# Patient Record
Sex: Female | Born: 1960 | Race: White | Hispanic: No | Smoking: Former smoker
Health system: Southern US, Community
[De-identification: ages and names within clinical notes are randomized; demographics above are authoritative.]

## PROBLEM LIST (undated history)

## (undated) DIAGNOSIS — I10 Essential (primary) hypertension: Secondary | ICD-10-CM

## (undated) DIAGNOSIS — I2510 Atherosclerotic heart disease of native coronary artery without angina pectoris: Secondary | ICD-10-CM

## (undated) DIAGNOSIS — E785 Hyperlipidemia, unspecified: Secondary | ICD-10-CM

## (undated) HISTORY — PX: CARDIAC CATHETERIZATION: SHX172

---

## 2021-06-03 ENCOUNTER — Other Ambulatory Visit (HOSPITAL_COMMUNITY): Payer: Self-pay

## 2021-06-03 ENCOUNTER — Encounter (HOSPITAL_COMMUNITY): Payer: Self-pay | Admitting: Interventional Cardiology

## 2021-06-03 ENCOUNTER — Ambulatory Visit (HOSPITAL_COMMUNITY): Admit: 2021-06-03 | Payer: BLUE CROSS/BLUE SHIELD | Admitting: Interventional Cardiology

## 2021-06-03 ENCOUNTER — Inpatient Hospital Stay (HOSPITAL_COMMUNITY): Payer: BLUE CROSS/BLUE SHIELD

## 2021-06-03 ENCOUNTER — Encounter (HOSPITAL_COMMUNITY)
Admission: EM | Disposition: A | Discharge status: Home / Self Care | Payer: Self-pay | Source: Home / Self Care | Attending: Interventional Cardiology

## 2021-06-03 ENCOUNTER — Inpatient Hospital Stay (HOSPITAL_COMMUNITY)
Admission: EM | Admit: 2021-06-03 | Discharge: 2021-06-05 | DRG: 247 | Disposition: A | Discharge status: Home / Self Care | Payer: BLUE CROSS/BLUE SHIELD | Attending: Interventional Cardiology | Admitting: Interventional Cardiology

## 2021-06-03 DIAGNOSIS — I214 Non-ST elevation (NSTEMI) myocardial infarction: Secondary | ICD-10-CM | POA: Diagnosis not present

## 2021-06-03 DIAGNOSIS — I10 Essential (primary) hypertension: Secondary | ICD-10-CM

## 2021-06-03 DIAGNOSIS — Z720 Tobacco use: Secondary | ICD-10-CM

## 2021-06-03 DIAGNOSIS — Z79899 Other long term (current) drug therapy: Secondary | ICD-10-CM

## 2021-06-03 DIAGNOSIS — I2111 ST elevation (STEMI) myocardial infarction involving right coronary artery: Secondary | ICD-10-CM | POA: Diagnosis not present

## 2021-06-03 DIAGNOSIS — I25110 Atherosclerotic heart disease of native coronary artery with unstable angina pectoris: Secondary | ICD-10-CM | POA: Diagnosis not present

## 2021-06-03 DIAGNOSIS — I6522 Occlusion and stenosis of left carotid artery: Secondary | ICD-10-CM | POA: Diagnosis not present

## 2021-06-03 DIAGNOSIS — D72829 Elevated white blood cell count, unspecified: Secondary | ICD-10-CM | POA: Diagnosis not present

## 2021-06-03 DIAGNOSIS — F419 Anxiety disorder, unspecified: Secondary | ICD-10-CM | POA: Diagnosis present

## 2021-06-03 DIAGNOSIS — R0602 Shortness of breath: Secondary | ICD-10-CM

## 2021-06-03 DIAGNOSIS — Z681 Body mass index (BMI) 19 or less, adult: Secondary | ICD-10-CM | POA: Diagnosis not present

## 2021-06-03 DIAGNOSIS — Z7982 Long term (current) use of aspirin: Secondary | ICD-10-CM

## 2021-06-03 DIAGNOSIS — I2582 Chronic total occlusion of coronary artery: Secondary | ICD-10-CM | POA: Diagnosis present

## 2021-06-03 DIAGNOSIS — I6523 Occlusion and stenosis of bilateral carotid arteries: Secondary | ICD-10-CM | POA: Diagnosis present

## 2021-06-03 DIAGNOSIS — Z91040 Latex allergy status: Secondary | ICD-10-CM

## 2021-06-03 DIAGNOSIS — I9589 Other hypotension: Secondary | ICD-10-CM | POA: Diagnosis not present

## 2021-06-03 DIAGNOSIS — Z7901 Long term (current) use of anticoagulants: Secondary | ICD-10-CM | POA: Diagnosis not present

## 2021-06-03 DIAGNOSIS — R0989 Other specified symptoms and signs involving the circulatory and respiratory systems: Secondary | ICD-10-CM | POA: Diagnosis not present

## 2021-06-03 DIAGNOSIS — I2119 ST elevation (STEMI) myocardial infarction involving other coronary artery of inferior wall: Secondary | ICD-10-CM | POA: Diagnosis not present

## 2021-06-03 DIAGNOSIS — R1310 Dysphagia, unspecified: Secondary | ICD-10-CM | POA: Diagnosis present

## 2021-06-03 DIAGNOSIS — E876 Hypokalemia: Secondary | ICD-10-CM | POA: Diagnosis not present

## 2021-06-03 DIAGNOSIS — E785 Hyperlipidemia, unspecified: Secondary | ICD-10-CM

## 2021-06-03 DIAGNOSIS — R64 Cachexia: Secondary | ICD-10-CM | POA: Diagnosis present

## 2021-06-03 DIAGNOSIS — I2510 Atherosclerotic heart disease of native coronary artery without angina pectoris: Secondary | ICD-10-CM

## 2021-06-03 DIAGNOSIS — Z955 Presence of coronary angioplasty implant and graft: Secondary | ICD-10-CM

## 2021-06-03 DIAGNOSIS — Z8249 Family history of ischemic heart disease and other diseases of the circulatory system: Secondary | ICD-10-CM

## 2021-06-03 DIAGNOSIS — F17210 Nicotine dependence, cigarettes, uncomplicated: Secondary | ICD-10-CM | POA: Diagnosis present

## 2021-06-03 DIAGNOSIS — Z20822 Contact with and (suspected) exposure to covid-19: Secondary | ICD-10-CM | POA: Diagnosis present

## 2021-06-03 DIAGNOSIS — R079 Chest pain, unspecified: Secondary | ICD-10-CM | POA: Diagnosis not present

## 2021-06-03 DIAGNOSIS — R001 Bradycardia, unspecified: Secondary | ICD-10-CM | POA: Diagnosis not present

## 2021-06-03 HISTORY — DX: Hyperlipidemia, unspecified: E78.5

## 2021-06-03 HISTORY — DX: ST elevation (STEMI) myocardial infarction involving other coronary artery of inferior wall: I21.19

## 2021-06-03 HISTORY — DX: Atherosclerotic heart disease of native coronary artery without angina pectoris: I25.10

## 2021-06-03 HISTORY — DX: Essential (primary) hypertension: I10

## 2021-06-03 HISTORY — PX: LEFT HEART CATH AND CORONARY ANGIOGRAPHY: CATH118249

## 2021-06-03 HISTORY — PX: CORONARY/GRAFT ACUTE MI REVASCULARIZATION: CATH118305

## 2021-06-03 HISTORY — DX: Tobacco use: Z72.0

## 2021-06-03 LAB — POCT ACTIVATED CLOTTING TIME
Activated Clotting Time: 269 seconds
Activated Clotting Time: 372 seconds
Activated Clotting Time: 299 seconds
Activated Clotting Time: 317 seconds
Activated Clotting Time: 317 seconds
Activated Clotting Time: >1000 seconds

## 2021-06-03 LAB — COMPREHENSIVE METABOLIC PANEL
ALT: 17 U/L (ref 0–44)
AST: 27 U/L (ref 15–41)
Albumin: 3.2 g/dL — ABNORMAL LOW (ref 3.5–5.0)
Alkaline Phosphatase: 59 U/L (ref 38–126)
Anion gap: 10 (ref 5–15)
BUN: 14 mg/dL (ref 6–20)
CO2: 19 mmol/L — ABNORMAL LOW (ref 22–32)
Calcium: 8.8 mg/dL — ABNORMAL LOW (ref 8.9–10.3)
Chloride: 108 mmol/L (ref 98–111)
Creatinine, Ser: 0.96 mg/dL (ref 0.44–1.00)
GFR, Estimated: >60 mL/min (ref >60)
Glucose, Bld: 145 mg/dL — ABNORMAL HIGH (ref 70–99)
Potassium: 3.9 mmol/L (ref 3.5–5.1)
Sodium: 137 mmol/L (ref 135–145)
Total Bilirubin: 0.7 mg/dL (ref 0.3–1.2)
Total Protein: 6.1 g/dL — ABNORMAL LOW (ref 6.5–8.1)

## 2021-06-03 LAB — LIPID PANEL
Cholesterol: 281 mg/dL — ABNORMAL HIGH (ref 0–200)
Cholesterol: 247 mg/dL — ABNORMAL HIGH (ref 0–200)
HDL: 81 mg/dL (ref >40)
HDL: 79 mg/dL (ref >40)
LDL Cholesterol: 173 mg/dL — ABNORMAL HIGH (ref 0–99)
LDL Cholesterol: 158 mg/dL — ABNORMAL HIGH (ref 0–99)
Total CHOL/HDL Ratio: 3.5 RATIO
Total CHOL/HDL Ratio: 3.1 RATIO
Triglycerides: 135 mg/dL (ref <150)
Triglycerides: 49 mg/dL (ref <150)
VLDL: 27 mg/dL (ref 0–40)
VLDL: 10 mg/dL (ref 0–40)

## 2021-06-03 LAB — POCT I-STAT, CHEM 8
BUN: 15 mg/dL (ref 6–20)
Calcium, Ion: 1.16 mmol/L (ref 1.15–1.40)
Chloride: 108 mmol/L (ref 98–111)
Creatinine, Ser: 0.8 mg/dL (ref 0.44–1.00)
Glucose, Bld: 146 mg/dL — ABNORMAL HIGH (ref 70–99)
HCT: 41 % (ref 36.0–46.0)
Hemoglobin: 13.9 g/dL (ref 12.0–15.0)
Potassium: 4 mmol/L (ref 3.5–5.1)
Sodium: 138 mmol/L (ref 135–145)
TCO2: 20 mmol/L — ABNORMAL LOW (ref 22–32)

## 2021-06-03 LAB — TROPONIN I (HIGH SENSITIVITY): Troponin I (High Sensitivity): 163 ng/L (ref <18)

## 2021-06-03 LAB — PROTIME-INR
INR: 0.9 (ref 0.8–1.2)
Prothrombin Time: 12.2 seconds (ref 11.4–15.2)

## 2021-06-03 LAB — RESP PANEL BY RT-PCR (FLU A&B, COVID) ARPGX2
Influenza A by PCR: NEGATIVE
Influenza B by PCR: NEGATIVE
SARS Coronavirus 2 by RT PCR: NEGATIVE

## 2021-06-03 LAB — CBC
HCT: 39.2 % (ref 36.0–46.0)
Hemoglobin: 13.1 g/dL (ref 12.0–15.0)
MCH: 29 pg (ref 26.0–34.0)
MCHC: 33.4 g/dL (ref 30.0–36.0)
MCV: 86.9 fL (ref 80.0–100.0)
Platelets: 381 10*3/uL (ref 150–400)
RBC: 4.51 MIL/uL (ref 3.87–5.11)
RDW: 14.1 % (ref 11.5–15.5)
WBC: 13.5 10*3/uL — ABNORMAL HIGH (ref 4.0–10.5)
nRBC: 0 % (ref 0.0–0.2)

## 2021-06-03 LAB — HEMOGLOBIN A1C
Hgb A1c MFr Bld: 5.6 % (ref 4.8–5.6)
Mean Plasma Glucose: 114.02 mg/dL

## 2021-06-03 LAB — APTT: aPTT: 26 seconds (ref 24–36)

## 2021-06-03 SURGERY — LEFT HEART CATH AND CORONARY ANGIOGRAPHY
Anesthesia: LOCAL

## 2021-06-03 MED ORDER — ENOXAPARIN SODIUM 40 MG/0.4ML IJ SOSY
40.0000 mg | PREFILLED_SYRINGE | INTRAMUSCULAR | Status: DC
  Administered 2021-06-04: 09:00:00 40 mg via SUBCUTANEOUS
  Filled 2021-06-03: qty 0.4

## 2021-06-03 MED ORDER — SODIUM CHLORIDE 0.9 % IV SOLN
INTRAVENOUS | Status: AC | PRN
  Administered 2021-06-03: 20 mL/h via INTRAVENOUS

## 2021-06-03 MED ORDER — METOPROLOL SUCCINATE ER 25 MG PO TB24
12.5000 mg | ORAL_TABLET | Freq: Every day | ORAL | Status: DC
  Administered 2021-06-03: 15:00:00 12.5 mg via ORAL
  Filled 2021-06-03: qty 1

## 2021-06-03 MED ORDER — TIROFIBAN HCL IN NACL 5-0.9 MG/100ML-% IV SOLN
INTRAVENOUS | Status: AC | PRN
  Administered 2021-06-03: .075 ug/kg/min via INTRAVENOUS

## 2021-06-03 MED ORDER — LOSARTAN POTASSIUM 25 MG PO TABS: 25.0000 mg | ORAL_TABLET | Freq: Every day | ORAL | Status: DC

## 2021-06-03 MED ORDER — HEPARIN (PORCINE) IN NACL 1000-0.9 UT/500ML-% IV SOLN
INTRAVENOUS | Status: DC | PRN
  Administered 2021-06-03: 500 mL

## 2021-06-03 MED ORDER — SODIUM CHLORIDE 0.9 % IV SOLN: 250.0000 mL | INTRAVENOUS | Status: DC | PRN

## 2021-06-03 MED ORDER — SODIUM CHLORIDE 0.9% FLUSH
3.0000 mL | Freq: Two times a day (BID) | INTRAVENOUS | Status: DC
  Administered 2021-06-04 – 2021-06-05 (×4): 3 mL via INTRAVENOUS

## 2021-06-03 MED ORDER — LIDOCAINE HCL (PF) 1 % IJ SOLN
INTRAMUSCULAR | Status: DC | PRN
  Administered 2021-06-03: 2 mL

## 2021-06-03 MED ORDER — TICAGRELOR 90 MG PO TABS
ORAL_TABLET | ORAL | Status: AC
  Filled 2021-06-03: qty 2

## 2021-06-03 MED ORDER — MIDAZOLAM HCL 2 MG/2ML IJ SOLN
INTRAMUSCULAR | Status: DC | PRN
  Administered 2021-06-03: .5 mg via INTRAVENOUS

## 2021-06-03 MED ORDER — VERAPAMIL HCL 2.5 MG/ML IV SOLN
INTRAVENOUS | Status: AC
  Filled 2021-06-03: qty 2

## 2021-06-03 MED ORDER — FENTANYL CITRATE (PF) 100 MCG/2ML IJ SOLN
INTRAMUSCULAR | Status: DC | PRN
  Administered 2021-06-03 (×2): 25 ug via INTRAVENOUS

## 2021-06-03 MED ORDER — SODIUM CHLORIDE 0.9 % IV SOLN
INTRAVENOUS | Status: DC | PRN
  Administered 2021-06-03: 20 mL/h via INTRAVENOUS

## 2021-06-03 MED ORDER — HEPARIN (PORCINE) IN NACL 1000-0.9 UT/500ML-% IV SOLN
INTRAVENOUS | Status: DC | PRN
  Administered 2021-06-03 (×2): 500 mL

## 2021-06-03 MED ORDER — TIROFIBAN HCL IN NACL 5-0.9 MG/100ML-% IV SOLN
INTRAVENOUS | Status: AC
  Filled 2021-06-03: qty 100

## 2021-06-03 MED ORDER — ACETAMINOPHEN 325 MG PO TABS: 650.0000 mg | ORAL_TABLET | ORAL | Status: DC | PRN

## 2021-06-03 MED ORDER — FUROSEMIDE 10 MG/ML IJ SOLN
40.0000 mg | Freq: Once | INTRAMUSCULAR | Status: AC
  Administered 2021-06-03: 19:00:00 40 mg via INTRAVENOUS
  Filled 2021-06-03: qty 4

## 2021-06-03 MED ORDER — HEPARIN SODIUM (PORCINE) 1000 UNIT/ML IJ SOLN
INTRAMUSCULAR | Status: DC | PRN
  Administered 2021-06-03: 6000 [IU] via INTRAVENOUS
  Administered 2021-06-03 (×2): 2000 [IU] via INTRAVENOUS

## 2021-06-03 MED ORDER — ASPIRIN 81 MG PO CHEW
81.0000 mg | CHEWABLE_TABLET | Freq: Every day | ORAL | Status: DC
  Administered 2021-06-04 – 2021-06-05 (×2): 81 mg via ORAL
  Filled 2021-06-03 (×2): qty 1

## 2021-06-03 MED ORDER — NITROGLYCERIN 1 MG/10 ML FOR IR/CATH LAB
INTRA_ARTERIAL | Status: DC | PRN
  Administered 2021-06-03 (×2): 200 ug via INTRACORONARY

## 2021-06-03 MED ORDER — IOHEXOL 350 MG/ML SOLN
INTRAVENOUS | Status: DC | PRN
  Administered 2021-06-03: 14:00:00 220 mL

## 2021-06-03 MED ORDER — VERAPAMIL HCL 2.5 MG/ML IV SOLN
INTRAVENOUS | Status: DC | PRN
  Administered 2021-06-03: 12:00:00 10 mL via INTRA_ARTERIAL

## 2021-06-03 MED ORDER — ATROPINE SULFATE 1 MG/10ML IJ SOSY
PREFILLED_SYRINGE | INTRAMUSCULAR | Status: DC | PRN
  Administered 2021-06-03: 1 mg via INTRAVENOUS

## 2021-06-03 MED ORDER — SODIUM CHLORIDE 0.9% FLUSH: 3.0000 mL | INTRAVENOUS | Status: DC | PRN

## 2021-06-03 MED ORDER — SODIUM CHLORIDE 0.9 % WEIGHT BASED INFUSION
2.0000 mL/kg/h | INTRAVENOUS | Status: AC
  Administered 2021-06-03: 15:00:00 2 mL/kg/h via INTRAVENOUS

## 2021-06-03 MED ORDER — POTASSIUM CHLORIDE CRYS ER 20 MEQ PO TBCR
20.0000 meq | EXTENDED_RELEASE_TABLET | Freq: Once | ORAL | Status: AC
  Administered 2021-06-03: 19:00:00 20 meq via ORAL
  Filled 2021-06-03: qty 1

## 2021-06-03 MED ORDER — FENTANYL CITRATE (PF) 100 MCG/2ML IJ SOLN
INTRAMUSCULAR | Status: AC
  Filled 2021-06-03: qty 2

## 2021-06-03 MED ORDER — ONDANSETRON HCL 4 MG/2ML IJ SOLN
4.0000 mg | Freq: Four times a day (QID) | INTRAMUSCULAR | Status: DC | PRN
  Administered 2021-06-03 – 2021-06-04 (×2): 4 mg via INTRAVENOUS
  Filled 2021-06-03 (×2): qty 2

## 2021-06-03 MED ORDER — HEPARIN (PORCINE) IN NACL 1000-0.9 UT/500ML-% IV SOLN
INTRAVENOUS | Status: AC
  Filled 2021-06-03: qty 1000

## 2021-06-03 MED ORDER — TIROFIBAN (AGGRASTAT) BOLUS VIA INFUSION
INTRAVENOUS | Status: DC | PRN
  Administered 2021-06-03: 13:00:00 895 ug via INTRAVENOUS

## 2021-06-03 MED ORDER — HEPARIN (PORCINE) IN NACL 1000-0.9 UT/500ML-% IV SOLN
INTRAVENOUS | Status: AC
  Filled 2021-06-03: qty 500

## 2021-06-03 MED ORDER — ATROPINE SULFATE 1 MG/10ML IJ SOSY
PREFILLED_SYRINGE | INTRAMUSCULAR | Status: AC
  Filled 2021-06-03: qty 10

## 2021-06-03 MED ORDER — LOSARTAN POTASSIUM 50 MG PO TABS
50.0000 mg | ORAL_TABLET | Freq: Every day | ORAL | Status: DC
  Administered 2021-06-04 – 2021-06-05 (×2): 50 mg via ORAL
  Filled 2021-06-03 (×2): qty 1

## 2021-06-03 MED ORDER — ATORVASTATIN CALCIUM 80 MG PO TABS
80.0000 mg | ORAL_TABLET | Freq: Every day | ORAL | Status: DC
  Administered 2021-06-03 – 2021-06-05 (×3): 80 mg via ORAL
  Filled 2021-06-03 (×3): qty 1

## 2021-06-03 MED ORDER — NITROGLYCERIN 1 MG/10 ML FOR IR/CATH LAB
INTRA_ARTERIAL | Status: AC
  Filled 2021-06-03: qty 10

## 2021-06-03 MED ORDER — TICAGRELOR 90 MG PO TABS
ORAL_TABLET | ORAL | Status: DC | PRN
  Administered 2021-06-03: 180 mg via ORAL

## 2021-06-03 MED ORDER — LABETALOL HCL 5 MG/ML IV SOLN
10.0000 mg | INTRAVENOUS | Status: AC | PRN
  Administered 2021-06-03 (×3): 10 mg via INTRAVENOUS
  Filled 2021-06-03 (×4): qty 4

## 2021-06-03 MED ORDER — TICAGRELOR 90 MG PO TABS
90.0000 mg | ORAL_TABLET | Freq: Two times a day (BID) | ORAL | Status: DC
  Administered 2021-06-03 – 2021-06-05 (×4): 90 mg via ORAL
  Filled 2021-06-03 (×4): qty 1

## 2021-06-03 MED ORDER — OXYCODONE HCL 5 MG PO TABS: 5.0000 mg | ORAL_TABLET | ORAL | Status: DC | PRN

## 2021-06-03 MED ORDER — ONDANSETRON HCL 4 MG/2ML IJ SOLN
INTRAMUSCULAR | Status: DC | PRN
  Administered 2021-06-03: 4 mg via INTRAVENOUS

## 2021-06-03 MED ORDER — HEPARIN SODIUM (PORCINE) 1000 UNIT/ML IJ SOLN
INTRAMUSCULAR | Status: AC
  Filled 2021-06-03: qty 10

## 2021-06-03 MED ORDER — LIDOCAINE HCL (PF) 1 % IJ SOLN
INTRAMUSCULAR | Status: AC
  Filled 2021-06-03: qty 30

## 2021-06-03 MED ORDER — MIDAZOLAM HCL 2 MG/2ML IJ SOLN
INTRAMUSCULAR | Status: AC
  Filled 2021-06-03: qty 2

## 2021-06-03 MED ORDER — HYDRALAZINE HCL 20 MG/ML IJ SOLN
10.0000 mg | INTRAMUSCULAR | Status: AC | PRN
  Administered 2021-06-03 (×4): 10 mg via INTRAVENOUS
  Filled 2021-06-03 (×4): qty 1

## 2021-06-03 MED ORDER — METOPROLOL SUCCINATE ER 25 MG PO TB24
25.0000 mg | ORAL_TABLET | Freq: Every day | ORAL | Status: DC
  Administered 2021-06-04 – 2021-06-05 (×2): 25 mg via ORAL
  Filled 2021-06-03 (×2): qty 1

## 2021-06-03 SURGICAL SUPPLY — 29 items
BALL SAPPHIRE NC24 3.0X26 (BALLOONS) ×3
BALLN SAPPHIRE 2.0X20 (BALLOONS) ×3
BALLN SAPPHIRE ~~LOC~~ 3.5X15 (BALLOONS) ×2 IMPLANT
BALLOON SAPPHIRE 2.0X20 (BALLOONS) IMPLANT
BALLOON SAPPHIRE NC24 3.0X26 (BALLOONS) IMPLANT
CATH INFINITI 5 FR JL3.5 (CATHETERS) ×2 IMPLANT
CATH INFINITI 5FR MULTPACK ANG (CATHETERS) ×2 IMPLANT
CATH INFINITI JR4 5F (CATHETERS) ×2 IMPLANT
CATH LAUNCHER 6FR IMA SH (CATHETERS) ×2 IMPLANT
CATH VISTA GUIDE 6FR IM 90 CM (CATHETERS) ×2 IMPLANT
CATH VISTA GUIDE 6FR JR4 (CATHETERS) ×2 IMPLANT
DEVICE RAD TR BAND REGULAR (VASCULAR PRODUCTS) ×2 IMPLANT
ELECT DEFIB PAD ADLT CADENCE (PAD) ×2 IMPLANT
GLIDESHEATH SLEND A-KIT 6F 22G (SHEATH) ×2 IMPLANT
GUIDELINER 6F (CATHETERS) ×2 IMPLANT
GUIDEWIRE INQWIRE 1.5J.035X260 (WIRE) IMPLANT
INQWIRE 1.5J .035X260CM (WIRE) ×3
KIT ENCORE 26 ADVANTAGE (KITS) ×2 IMPLANT
KIT HEART LEFT (KITS) ×3 IMPLANT
PACK CARDIAC CATHETERIZATION (CUSTOM PROCEDURE TRAY) ×3 IMPLANT
SHEATH PROBE COVER 6X72 (BAG) ×2 IMPLANT
STENT ONYX FRONTIER 2.5X30 (Permanent Stent) ×2 IMPLANT
STENT ONYX FRONTIER 3.0X18 (Permanent Stent) ×2 IMPLANT
STENT ONYX FRONTIER 3.5X22 (Permanent Stent) ×2 IMPLANT
TRANSDUCER W/STOPCOCK (MISCELLANEOUS) ×3 IMPLANT
TUBING CIL FLEX 10 FLL-RA (TUBING) ×3 IMPLANT
WIRE ASAHI PROWATER 180CM (WIRE) ×2 IMPLANT
WIRE COUGAR XT STRL 190CM (WIRE) ×2 IMPLANT
WIRE HI TORQ VERSACORE-J 145CM (WIRE) ×2 IMPLANT

## 2021-06-03 NOTE — Progress Notes (Signed)
BP remains 160s / 90s after giving 40 mg hydralazine and 40 mg lasix. PRN hydralazine discontinued. MD notified and is ok with BP in the 180s. No new orders at this time. Will continue to monitor

## 2021-06-03 NOTE — TOC Benefit Eligibility Note (Signed)
Patient Teacher, English as a foreign language completed.    The patient is currently admitted and upon discharge could be taking Brilinta 90 mg.  The current 30 day co-pay is, $25.00.   The patient is insured through [employer] [place], [place] Patient Advocate Specialist [place] Patient Advocate Team Direct Number: [phone]  Fax: [phone]

## 2021-06-03 NOTE — H&P (Signed)
CRITICAL CARE TIME [place]   Cardiology Admission History and Physical:   Patient ID: Carrie Butler MRN: [id number]; DOB: 1961-05-23   Admission date: 06/03/2021  PCP:  No primary care provider on file.   [facility] Providers Cardiologist: Illene Labrador, III   Chief Complaint:  Inferior STEMI  Patient Profile:   Carrie Butler is a 60 y.o. female with a longstanding history of tobacco use and hypertension who is being seen 06/03/2021 for the evaluation of 18-hour history of intermittent chest pain that became severe at 4:30 AM and was graded at 10 out of 10 in severity..  History of Present Illness:   [name] who began having chest discomfort around 6 PM on 06/02/2021.  The discomfort waxed and waned.  At 4:30 AM she began having severe pain that was unremitting.  EMS was summoned.  Upon arrival at [facility] she was complains of 2 out of 10 pain.  Transmitted EKG prior to arrival revealed inferior ST elevation with reciprocal V1 and V2 T wave and ST segment changes.  1 pack/day for greater than 20 years.  Prefers naturopathic therapy to medication management for hypertension.  She uses cannabis for pain control and management of lipids.  She had cannabis this morning at around 7:30 AM.  She feels that blood pressure medications caused her lipid levels to go up.  She is not on any conventional medical therapy.   History reviewed. No pertinent past medical history.  Past Surgical History:  Procedure Laterality Date   CORONARY/GRAFT ACUTE MI REVASCULARIZATION N/A 06/03/2021   Procedure: Coronary/Graft Acute MI Revascularization;  Surgeon: Belva Crome, MD;  Location: [place] [facility];  Service: Cardiovascular;  Laterality: N/A;   LEFT HEART CATH AND CORONARY ANGIOGRAPHY N/A 06/03/2021   Procedure: LEFT HEART CATH AND CORONARY ANGIOGRAPHY;  Surgeon: Belva Crome, MD;  Location: [place] [facility];  Service: Cardiovascular;  Laterality: N/A;      Medications Prior to Admission: Prior to Admission medications   Not on File     Allergies:   Not on File  Social History:   Social History   Socioeconomic History   Marital status: Married    Spouse name: Not on file   Number of children: Not on file   Years of education: Not on file   Highest education level: Not on file  Occupational History   Not on file  Tobacco Use   Smoking status: Not on file   Smokeless tobacco: Not on file  Substance and Sexual Activity   Alcohol use: Not on file   Drug use: Not on file   Sexual activity: Not on file  Other Topics Concern   Not on file  Social History Narrative   Not on file   Social Determinants of Health   Financial Resource Strain: Not on file  Food Insecurity: Not on file  Transportation Needs: Not on file  Physical Activity: Not on file  Stress: Not on file  Social Connections: Not on file  Intimate Partner Violence: Not on file    Family History: Mother had MI in her 60s. The patient's family history is not on file.    ROS:  Please see the history of present illness.  Has cold induced urticaria.  She is allergic to latex.  Her significant other, [place], feels that we will not be able to get her to take medications of any type.  All other ROS reviewed and negative.  Physical Exam/Data:   Vitals:   06/03/21 1200 06/03/21 1430 06/03/21 1451 06/03/21 1453  BP:  (!) 180/135 (!) 172/104 (!) 172/104  Pulse:  93 80   Resp:  20    Temp:  98.6 F (37 C)    TempSrc:  Oral    SpO2:  100%    Weight: 35.8 kg 35.8 kg    Height: 4\' 10"  (1.473 m) 4\' 10"  (1.473 m)      Intake/Output Summary (Last 24 hours) at 06/03/2021 1523 Last data filed at 06/03/2021 1500 Gross per 24 hour  Intake --  Output 200 ml  Net -200 ml   Last 3 Weights 06/03/2021 06/03/2021  Weight (lbs) 78 lb 15.9 oz 78 lb 15.9 oz  Weight (kg) 35.83 kg 35.83 kg     Body mass index is 16.51 kg/m.  General:  Well nourished, well developed, in  no acute distress.  She appears much older than her stated age. HEENT: normal Neck: moderate JVD Vascular: No carotid bruits; Distal pulses 2+ bilaterally   Cardiac:  normal S1, S2; RRR; no murmur. Lungs:  clear to auscultation bilaterally, no wheezing, rhonchi or rales  Abd: soft, nontender, no hepatomegaly  Ext: no edema Musculoskeletal:  No deformities, BUE and BLE strength normal and equal Skin: warm and dry  Neuro:  CNs 2-12 intact, no focal abnormalities noted Psych:  Normal affect    EKG:  The ECG that was done by EMS in [facility] was personally reviewed and demonstrates demonstrated 2 mm of ST elevation in lead II, III, aVF, and ST depression in V1 and V2.  Relevant CV Studies: LDL is 173.  Total cholesterol 281.  Albumin is 3.2. Hemoglobin A1c 5.6  Laboratory Data:  High Sensitivity Troponin:   Recent Labs  Lab 06/03/21 1144  TROPONINIHS 163*      Chemistry Recent Labs  Lab 06/03/21 1144 06/03/21 1152  NA 137 138  K 3.9 4.0  CL 108 108  CO2 19*  --   GLUCOSE 145* 146*  BUN 14 15  CREATININE 0.96 0.80  CALCIUM 8.8*  --   GFRNONAA >60  --   ANIONGAP 10  --     Recent Labs  Lab 06/03/21 1144  PROT 6.1*  ALBUMIN 3.2*  AST 27  ALT 17  ALKPHOS 59  BILITOT 0.7   Lipids  Recent Labs  Lab 06/03/21 1144  CHOL 281*  TRIG 135  HDL 81  LDLCALC 173*  CHOLHDL 3.5   Hematology Recent Labs  Lab 06/03/21 1144 06/03/21 1152  WBC 13.5*  --   RBC 4.51  --   HGB 13.1 13.9  HCT 39.2 41.0  MCV 86.9  --   MCH 29.0  --   MCHC 33.4  --   RDW 14.1  --   PLT 381  --    Thyroid No results for input(s): TSH, FREET4 in the last 168 hours. BNPNo results for input(s): BNP, PROBNP in the last 168 hours.  DDimer No results for input(s): DDIMER in the last 168 hours.   Radiology/Studies:  CARDIAC CATHETERIZATION  Result Date: 06/03/2021   A stent was successfully placed.   Post intervention, there is a 50% residual stenosis.   Post intervention, there  is a 70% residual stenosis. Acute inferior ST elevation MI due to total mid vessel occlusion Successful complicated procedure requiring guide catheter extension, double wire, and overlapping stenting from distal vessel to the ostium of RCA.  3 stents used.  Residual stenosis  of approximately 30% in the mid vessel due to prolapse of tissue at site of stent overlap.  The ostial RCA stent overhangs into the aorta significantly.  Not likely to be able to selectively engage in the future.  Beyond the stented segment there is a angulated 50 to 70% stenosis. Ostial 70 to 80% circumflex.  First obtuse marginal 70% mid.  Mid circumflex 50%. Distal left main 20 to 30%. Ostial LAD 20 to 30%.  30% mid. Moderate inferobasal hypokinesis with EF 40%. RECOMMENDATIONS: Aggressive risk factor modification including smoking cessation, and encouraging the patient to use medications for control of blood pressure and cholesterol. High risk for recurrent acute ischemic events due to phenotype and conventional medical therapy refusal     Assessment and Plan:   Waxing and waning chest discomfort for 18 hours prior to admission with severe discomfort commencing at 4:30 AM and EKG demonstrating evidence of acute inferior wall injury consistent with ST elevation MI. Skeptical of medical therapy and prefers naturopathic and cannabis-based therapy. Hyperlipidemia untreated Primary hypertension untreated  RECOMMENDATIONS:  Coronary angiography and mechanical revascularization if indicated Medical therapy risk factors. Smoking cessation.   Risk Assessment/Risk Scores:    TIMI Risk Score for ST  Elevation MI:   The patient's TIMI risk score is 5, which indicates a 12.4% risk of all cause mortality at 30 days.     Severity of Illness: The appropriate patient status for this patient is INPATIENT. Inpatient status is judged to be reasonable and necessary in order to provide the required intensity of service to ensure the  patient's safety. The patient's presenting symptoms, physical exam findings, and initial radiographic and laboratory data in the context of their chronic comorbidities is felt to place them at high risk for further clinical deterioration. Furthermore, it is not anticipated that the patient will be medically stable for discharge from the hospital within 2 midnights of admission.   * I certify that at the point of admission it is my clinical judgment that the patient will require inpatient hospital care spanning beyond 2 midnights from the point of admission due to high intensity of service, high risk for further deterioration and high frequency of surveillance required.*   For questions or updates, please contact [place] Please consult [email or web address] for contact info under     CRITICAL CARE TIME: 70 minutes   Signed, Sinclair Grooms, MD  06/03/2021 3:23 PM  In [facility]

## 2021-06-03 NOTE — H&P (Signed)
Completed.

## 2021-06-03 NOTE — Progress Notes (Signed)
Called for TR band check. TR band present on right radial artery. No hematoma present. Puncture site oozing slightly. BP 183/136  2cc air added to TRband for a total of 16cc. 2H staff treating blood pressure. SPo2 100% as measured in right thumb. Capillary refill present.   Dr. Tamala Julian present at bedside.

## 2021-06-03 NOTE — Progress Notes (Signed)
° °  Called by RN, pt was becoming SOB and uncomfortable.  Post stents to RCA due to STEMI.   I gave 40 lasix IV and ordered PCXR.  BP is elevated as well so this may help with both.  She did receive toprol XL 12.5 mg earlier as well.    Cecilie Kicks, FNP-C [facility] [place]  [phone]  06/03/2021.

## 2021-06-03 NOTE — CV Procedure (Signed)
RCA total occlusion mid vessel.  Vessel is very tortuous.  50 to 70% distal stenosis with an acute angulation. 70% ostial circumflex.  Obtuse marginal branches contain significant tortuosity. Left main contains 30% distal narrowing LAD is large, tortuous, with luminal irregularities but no fixed obstruction. Inferobasal severe hypokinesis.  EF 50%.  LVEDP 14 mmHg. Complicated PCI of total occlusion proximal to mid RCA related to vessel tortuosity proximal and distal requiring double wires, guide catheter extension, and IMA guide catheter with sideholes. RCA 100% stenosis to less than 20% with tissue prolapse through the mid stent area.  TIMI grade III flow is noted.  Did not extend it from the very distal artery back to the ostium of the RCA.  The stent overhangs the ostium significantly and will probably prevent selective engagement in the future.

## 2021-06-03 NOTE — Progress Notes (Signed)
°   06/03/21 1046  Clinical Encounter Type  Visited With Family  Visit Type Initial;Psychological support;Social support;ED  Referral From Nurse   [facility] responded to Code STEMI in ED; when pt. arrived via EMS medical team decided to send her directly to cath lab; [facility] escorted pt.'s significant other to [place] waiting area to await update from team.  S.O. shared that pt. developed chest pain and left-sided numbness last night but refused to go to the hospital until this AM; no further needs expressed at this time.  Lindaann Pascal, Chaplain Pager: [phone]

## 2021-06-04 ENCOUNTER — Inpatient Hospital Stay (HOSPITAL_COMMUNITY): Payer: BLUE CROSS/BLUE SHIELD

## 2021-06-04 ENCOUNTER — Other Ambulatory Visit: Payer: Self-pay

## 2021-06-04 ENCOUNTER — Other Ambulatory Visit (HOSPITAL_COMMUNITY): Payer: Self-pay

## 2021-06-04 DIAGNOSIS — R64 Cachexia: Secondary | ICD-10-CM | POA: Diagnosis not present

## 2021-06-04 DIAGNOSIS — Z20822 Contact with and (suspected) exposure to covid-19: Secondary | ICD-10-CM | POA: Diagnosis not present

## 2021-06-04 DIAGNOSIS — I9589 Other hypotension: Secondary | ICD-10-CM | POA: Diagnosis not present

## 2021-06-04 LAB — CBC
HCT: 35.1 % — ABNORMAL LOW (ref 36.0–46.0)
Hemoglobin: 11.8 g/dL — ABNORMAL LOW (ref 12.0–15.0)
MCH: 29.1 pg (ref 26.0–34.0)
MCHC: 33.6 g/dL (ref 30.0–36.0)
MCV: 86.7 fL (ref 80.0–100.0)
Platelets: 344 10*3/uL (ref 150–400)
RBC: 4.05 MIL/uL (ref 3.87–5.11)
RDW: 14.2 % (ref 11.5–15.5)
WBC: 12.7 10*3/uL — ABNORMAL HIGH (ref 4.0–10.5)
nRBC: 0 % (ref 0.0–0.2)

## 2021-06-04 LAB — BASIC METABOLIC PANEL
Anion gap: 9 (ref 5–15)
BUN: 16 mg/dL (ref 6–20)
CO2: 23 mmol/L (ref 22–32)
Calcium: 8.7 mg/dL — ABNORMAL LOW (ref 8.9–10.3)
Chloride: 101 mmol/L (ref 98–111)
Creatinine, Ser: 1.1 mg/dL — ABNORMAL HIGH (ref 0.44–1.00)
GFR, Estimated: 58 mL/min — ABNORMAL LOW (ref >60)
Glucose, Bld: 129 mg/dL — ABNORMAL HIGH (ref 70–99)
Potassium: 3.5 mmol/L (ref 3.5–5.1)
Sodium: 133 mmol/L — ABNORMAL LOW (ref 135–145)

## 2021-06-04 LAB — HEMOGLOBIN A1C
Hgb A1c MFr Bld: 5.7 % — ABNORMAL HIGH (ref 4.8–5.6)
Mean Plasma Glucose: 116.89 mg/dL

## 2021-06-04 LAB — GLUCOSE, CAPILLARY: Glucose-Capillary: 116 mg/dL — ABNORMAL HIGH (ref 70–99)

## 2021-06-04 MED ORDER — METOPROLOL SUCCINATE ER 25 MG PO TB24
25.0000 mg | ORAL_TABLET | Freq: Every day | ORAL | 12 refills | Status: DC
  Filled 2021-06-04: qty 30, 30d supply, fill #0

## 2021-06-04 MED ORDER — HYDRALAZINE HCL 25 MG PO TABS
25.0000 mg | ORAL_TABLET | Freq: Three times a day (TID) | ORAL | Status: DC
  Administered 2021-06-04 – 2021-06-05 (×3): 25 mg via ORAL
  Filled 2021-06-04 (×3): qty 1

## 2021-06-04 MED ORDER — ENOXAPARIN SODIUM 30 MG/0.3ML IJ SOSY
30.0000 mg | PREFILLED_SYRINGE | INTRAMUSCULAR | Status: DC
  Administered 2021-06-05: 08:00:00 30 mg via SUBCUTANEOUS
  Filled 2021-06-04: qty 0.3

## 2021-06-04 MED ORDER — NITROGLYCERIN 2 % TD OINT
1.0000 [in_us] | TOPICAL_OINTMENT | Freq: Four times a day (QID) | TRANSDERMAL | Status: DC
  Administered 2021-06-04: 19:00:00 1 [in_us] via TOPICAL
  Filled 2021-06-04: qty 1
  Filled 2021-06-04: qty 30

## 2021-06-04 MED ORDER — LORAZEPAM 0.5 MG PO TABS
0.5000 mg | ORAL_TABLET | Freq: Four times a day (QID) | ORAL | Status: DC | PRN
  Filled 2021-06-04: qty 1

## 2021-06-04 MED ORDER — ATORVASTATIN CALCIUM 80 MG PO TABS
80.0000 mg | ORAL_TABLET | Freq: Every day | ORAL | 12 refills | Status: DC
  Filled 2021-06-04: qty 30, 30d supply, fill #0

## 2021-06-04 MED ORDER — CHLORHEXIDINE GLUCONATE CLOTH 2 % EX PADS
6.0000 | MEDICATED_PAD | Freq: Every day | CUTANEOUS | Status: DC
  Administered 2021-06-04: 17:00:00 6 via TOPICAL

## 2021-06-04 MED ORDER — HYDRALAZINE HCL 20 MG/ML IJ SOLN
10.0000 mg | Freq: Once | INTRAMUSCULAR | Status: AC
  Administered 2021-06-04: 18:00:00 10 mg via INTRAVENOUS
  Filled 2021-06-04: qty 1

## 2021-06-04 MED ORDER — TICAGRELOR 90 MG PO TABS
90.0000 mg | ORAL_TABLET | Freq: Two times a day (BID) | ORAL | 12 refills | Status: DC
  Filled 2021-06-04: qty 60, 30d supply, fill #0

## 2021-06-04 MED ORDER — POTASSIUM CHLORIDE CRYS ER 20 MEQ PO TBCR
40.0000 meq | EXTENDED_RELEASE_TABLET | Freq: Once | ORAL | Status: AC
  Administered 2021-06-04: 09:00:00 40 meq via ORAL
  Filled 2021-06-04: qty 2

## 2021-06-04 MED ORDER — HYDRALAZINE HCL 20 MG/ML IJ SOLN
10.0000 mg | Freq: Once | INTRAMUSCULAR | Status: AC
  Administered 2021-06-04: 19:00:00 10 mg via INTRAVENOUS

## 2021-06-04 MED ORDER — HYDRALAZINE HCL 20 MG/ML IJ SOLN
10.0000 mg | INTRAMUSCULAR | Status: DC | PRN
  Administered 2021-06-04: 17:00:00 10 mg via INTRAVENOUS
  Filled 2021-06-04: qty 1

## 2021-06-04 NOTE — Discharge Instructions (Signed)
Information about your medication: Brilinta (anti-platelet agent)  Generic Name (Brand): ticagrelor (Brilinta), twice daily medication  PURPOSE: You are taking this medication along with aspirin to lower your chance of having a heart attack, stroke, or blood clots in your heart stent. These can be fatal. Brilinta and aspirin help prevent platelets from sticking together and forming a clot that can block an artery or your stent.   Common SIDE EFFECTS you may experience include: bruising or bleeding more easily, shortness of breath  Do not stop taking BRILINTA without talking to the doctor who prescribes it for you. People who are treated with a stent and stop taking Brilinta too soon, have a higher risk of getting a blood clot in the stent, having a heart attack, or dying. If you stop Brilinta because of bleeding, or for other reasons, your risk of a heart attack or stroke may increase.   Tell all of your doctors and dentists that you are taking Brilinta. They should talk to the doctor who prescribed Brilinta for you before you have any surgery or invasive procedure.   Contact your health care provider if you experience: severe or uncontrollable bleeding, pink/red/brown urine, vomiting blood or vomit that looks like "coffee grounds", red or black stools (looks like tar), coughing up blood or blood clots ----------------------------------------------------------------------------------------------------------------------   Information about your medication: Statin (cholesterol-lowering agent)  Generic Name (Brand): atorvastatin (Lipitor) PURPOSE: You are taking this medication to lower your "bad" cholesterol (LDL) and to prevent heart attacks and strokes. Statins can also raise your "good" cholesterol (HDL).  Common SIDE EFFECTS you may experience include: muscle pain or weakness (especially in the legs) and upset stomach.  Take your medication exactly as prescribed. Do not eat large amounts of  grapefruit or grapefruit juice while taking this medication.  Contact your health care provider if you experience: severe muscle pain that does not improve, dark urine, or yellowing of your skin or eyes. ----------------------------------------------------------------------------------------------------------------------  Information about your medication:  Beta Blocker (Heart rate/Blood Pressure-lowering agent)  Generic Name (Brand): metoprolol succinate (Toprol XL)  PURPOSE: You are taking this medication to lower blood pressure and heart rate to help prevent further damage to your heart and to reduce your risk of death following your cardiac event.   Common SIDE EFFECTS you may experience include: fatigue, dizziness, diarrhea, or nausea. This medication may also mask the signs of hypoglycemia  Take your medication exactly as prescribed.   Contact your health care provider if you experience: severely lower bllod pressure, syncope, palpitations, or chest pain muscle ----------------------------------------------------------------------------------------------------------------------

## 2021-06-04 NOTE — Progress Notes (Signed)
Attempted carotid artery duplex x2, however patient is vomiting. Will attempt again tomorrow 12/24 as schedule permits.  06/04/2021 4:50 PM Kelby Aline., MHA, RVT, RDCS, RDMS

## 2021-06-04 NOTE — Progress Notes (Signed)
Progress Note  Patient Name: Carrie Butler Date of Encounter: 06/04/2021  [facility] Cardiologist: Dr. Tamala Julian  Subjective   Postop day #1 inferior MI treated with PCI and drug-eluting stenting of the mid and proximal RCA using 3 overlapping drug-eluting stents.  She did have moderate distal left main and high-grade ostial circumflex disease.  Her LV function was preserved.  She is pain-free this morning and ready for transfer.  Inpatient Medications    Scheduled Meds:  aspirin  81 mg Oral Daily   atorvastatin  80 mg Oral Daily   Chlorhexidine Gluconate Cloth  6 each Topical Daily   enoxaparin (LOVENOX) injection  40 mg Subcutaneous Q24H   losartan  50 mg Oral Daily   metoprolol succinate  25 mg Oral Daily   potassium chloride  40 mEq Oral Once   sodium chloride flush  3 mL Intravenous Q12H   ticagrelor  90 mg Oral BID   Continuous Infusions:  sodium chloride     PRN Meds: sodium chloride, acetaminophen, ondansetron (ZOFRAN) IV, oxyCODONE, sodium chloride flush   Vital Signs    Vitals:   06/04/21 0400 06/04/21 0500 06/04/21 0600 06/04/21 0836  BP: (!) 103/59 110/61 (!) 122/57   Pulse: 68 63 (!) 54   Resp: (!) 21 20 19    Temp:    98.7 F (37.1 C)  TempSrc:    Oral  SpO2: 99% 100% 100%   Weight:      Height:        Intake/Output Summary (Last 24 hours) at 06/04/2021 0843 Last data filed at 06/04/2021 0837 Gross per 24 hour  Intake --  Output 2650 ml  Net -2650 ml   Last 3 Weights 06/03/2021 06/03/2021  Weight (lbs) 78 lb 15.9 oz 78 lb 15.9 oz  Weight (kg) 35.83 kg 35.83 kg      Telemetry    Normal sinus rhythm- Personally Reviewed  ECG    Not performed today- Personally Reviewed  Physical Exam   GEN: No acute distress.   Neck: No JVD Cardiac: RRR, no murmurs, rubs, or gallops.  Respiratory: Clear to auscultation bilaterally. GI: Soft, nontender, non-distended  MS: No edema; No deformity. Neuro:  Nonfocal  Psych: Normal affect   Labs     High Sensitivity Troponin:   Recent Labs  Lab 06/03/21 1144  TROPONINIHS 163*     Chemistry Recent Labs  Lab 06/03/21 1144 06/03/21 1152 06/04/21 0259  NA 137 138 133*  K 3.9 4.0 3.5  CL 108 108 101  CO2 19*  --  23  GLUCOSE 145* 146* 129*  BUN 14 15 16   CREATININE 0.96 0.80 1.10*  CALCIUM 8.8*  --  8.7*  PROT 6.1*  --   --   ALBUMIN 3.2*  --   --   AST 27  --   --   ALT 17  --   --   ALKPHOS 59  --   --   BILITOT 0.7  --   --   GFRNONAA >60  --  58*  ANIONGAP 10  --  9    Lipids  Recent Labs  Lab 06/03/21 1623  CHOL 247*  TRIG 49  HDL 79  LDLCALC 158*  CHOLHDL 3.1    Hematology Recent Labs  Lab 06/03/21 1144 06/03/21 1152 06/04/21 0259  WBC 13.5*  --  12.7*  RBC 4.51  --  4.05  HGB 13.1 13.9 11.8*  HCT 39.2 41.0 35.1*  MCV 86.9  --  86.7  MCH 29.0  --  29.1  MCHC 33.4  --  33.6  RDW 14.1  --  14.2  PLT 381  --  344   Thyroid No results for input(s): TSH, FREET4 in the last 168 hours.  BNPNo results for input(s): BNP, PROBNP in the last 168 hours.  DDimer No results for input(s): DDIMER in the last 168 hours.   Radiology    CARDIAC CATHETERIZATION  Result Date: 06/03/2021   A stent was successfully placed.   Post intervention, there is a 50% residual stenosis.   Post intervention, there is a 70% residual stenosis. Acute inferior ST elevation MI due to total mid vessel occlusion Successful complicated procedure requiring guide catheter extension, double wire, and overlapping stenting from distal vessel to the ostium of RCA.  3 stents used.  Residual stenosis of approximately 30% in the mid vessel due to prolapse of tissue at site of stent overlap.  The ostial RCA stent overhangs into the aorta significantly.  Not likely to be able to selectively engage in the future.  Beyond the stented segment there is a angulated 50 to 70% stenosis. Ostial 70 to 80% circumflex.  First obtuse marginal 70% mid.  Mid circumflex 50%. Distal left main 20 to 30%. Ostial  LAD 20 to 30%.  30% mid. Moderate inferobasal hypokinesis with EF 40%. RECOMMENDATIONS: Aggressive risk factor modification including smoking cessation, and encouraging the patient to use medications for control of blood pressure and cholesterol. High risk for recurrent acute ischemic events due to phenotype and conventional medical therapy refusal   DG CHEST PORT 1 VIEW  Result Date: 06/03/2021 CLINICAL DATA:  Shortness of breath EXAM: PORTABLE CHEST 1 VIEW COMPARISON:  None. FINDINGS: The heart size and mediastinal contours are within normal limits. Both lungs are clear. The visualized skeletal structures are unremarkable. Bilateral nipple shadows are noted. IMPRESSION: No active disease. Electronically Signed   By: Inez Catalina M.D.   On: 06/03/2021 19:15    Cardiac Studies   Cardiac catheterization/PCI drug-eluting stent RCA (06/03/2021)  Conclusion      A stent was successfully placed.   Post intervention, there is a 50% residual stenosis.   Post intervention, there is a 70% residual stenosis.   Acute inferior ST elevation MI due to total mid vessel occlusion Successful complicated procedure requiring guide catheter extension, double wire, and overlapping stenting from distal vessel to the ostium of RCA.  3 stents used.  Residual stenosis of approximately 30% in the mid vessel due to prolapse of tissue at site of stent overlap.  The ostial RCA stent overhangs into the aorta significantly.  Not likely to be able to selectively engage in the future.  Beyond the stented segment there is a angulated 50 to 70% stenosis. Ostial 70 to 80% circumflex.  First obtuse marginal 70% mid.  Mid circumflex 50%. Distal left main 20 to 30%. Ostial LAD 20 to 30%.  30% mid. Moderate inferobasal hypokinesis with EF 40%.   RECOMMENDATIONS:   Aggressive risk factor modification including smoking cessation, and encouraging the patient to use medications for control of blood pressure and cholesterol. High  risk for recurrent acute ischemic events due to phenotype and conventional medical therapy refusal  Coronary Diagrams  Diagnostic Dominance: Right Intervention    Patient Profile     60 y.o. female with a history of hypertension and tobacco abuse who was admitted with inferior STEMI transfer from [facility].  She underwent urgent cardiac catheterization via the right radial approach  by Dr. Tamala Julian revealing an occluded proximal dominant RCA.  The vessel was tortuous revascularization was technically challenging.  She had 3 overlapping drug-eluting stents from the mid to distal vessel out to the ostium with slight extension beyond the ostium .  She did have mild to moderate distal left main and high-grade ostial circumflex disease with preserved LV function.  She is asymptomatic this morning.  Assessment & Plan    1: Inferior STEMI-postop day 1 inferior STEMI treated with PCI drug-eluting stenting of 3 overlapping drug-eluting stents by Dr. Tamala Julian and effectively challenging procedure because of tortuosity.  She did have moderate distal left main and high-grade ostial circumflex disease.  She is on dual antiplatelet therapy including aspirin and Brilinta which she will need to be on uninterrupted for 12 months.  She is also on appropriate guideline directed optimal medical therapy for post MI patients including high-dose statin therapy.  2: Essential hypertension-on no blood pressure medicines at home.  Currently on beta-blocker and losartan.  3: Hyperlipidemia-LDL 158.  High-dose statin therapy started  4: Tobacco abuse-strongly encouraged tobacco cessation  Patient is postop day 1 inferior STEMI.  I believe that medication compliance is going to be an issue.  I stressed the importance of taking her medicines as directed as well as pursuing aggressive factor modification.  She is stable for transfer to telemetry today.  Rehab will ambulate.  I anticipate discharge tomorrow.  She lives in [place]  and can follow-up with our office there.  For questions or updates, please contact [place] Please consult [email or web address] for contact info under        Signed, Quay Burow, MD  06/04/2021, 8:43 AM

## 2021-06-04 NOTE — Progress Notes (Signed)
EKG CRITICAL VALUE     12 lead EKG performed.  Critical value noted.  Herma Mering RN notified.   Warren Lacy, CCT 06/04/2021 7:34 AM

## 2021-06-04 NOTE — Progress Notes (Signed)
Pt ambulated with RN earlier. Sts she tolerated well but tired after walk. Discussed with pt and husband MI, stent, Brilinta importance, restrictions, diet, smoking cessation, exercise, NTG, and CRPII. Pt very receptive. She is being open minded to needing meds now and sts she is willing to take Brilinta and statin. She plans to quit smoking. She already eats heart healthy. Will refer to [place]. Husband very supportive. [phone] [place], ACSM 11:49 AM 06/04/2021

## 2021-06-05 ENCOUNTER — Inpatient Hospital Stay (HOSPITAL_COMMUNITY): Payer: BLUE CROSS/BLUE SHIELD

## 2021-06-05 ENCOUNTER — Other Ambulatory Visit: Payer: Self-pay | Admitting: Physician Assistant

## 2021-06-05 DIAGNOSIS — R0989 Other specified symptoms and signs involving the circulatory and respiratory systems: Secondary | ICD-10-CM

## 2021-06-05 DIAGNOSIS — I6522 Occlusion and stenosis of left carotid artery: Secondary | ICD-10-CM

## 2021-06-05 HISTORY — DX: Occlusion and stenosis of left carotid artery: I65.22

## 2021-06-05 LAB — LIPOPROTEIN A (LPA): Lipoprotein (a): 152.5 nmol/L — ABNORMAL HIGH (ref <75.0)

## 2021-06-05 MED ORDER — NITROGLYCERIN 0.4 MG SL SUBL: 0.4000 mg | SUBLINGUAL_TABLET | SUBLINGUAL | 3 refills | Status: DC | PRN

## 2021-06-05 MED ORDER — METOPROLOL SUCCINATE ER 50 MG PO TB24: 50.0000 mg | ORAL_TABLET | Freq: Every day | ORAL | 3 refills | Status: DC

## 2021-06-05 MED ORDER — HYDRALAZINE HCL 25 MG PO TABS: 25.0000 mg | ORAL_TABLET | Freq: Three times a day (TID) | ORAL | 5 refills | Status: DC

## 2021-06-05 MED ORDER — ATORVASTATIN CALCIUM 80 MG PO TABS: 80.0000 mg | ORAL_TABLET | Freq: Every day | ORAL | 12 refills | Status: DC

## 2021-06-05 MED ORDER — LOSARTAN POTASSIUM 50 MG PO TABS: 50.0000 mg | ORAL_TABLET | Freq: Every day | ORAL | 1 refills | Status: DC

## 2021-06-05 MED ORDER — METOPROLOL SUCCINATE ER 50 MG PO TB24: 50.0000 mg | ORAL_TABLET | Freq: Every day | ORAL | Status: DC

## 2021-06-05 MED ORDER — ASPIRIN 81 MG PO CHEW: 81.0000 mg | CHEWABLE_TABLET | Freq: Every day | ORAL | Status: DC

## 2021-06-05 MED ORDER — TICAGRELOR 90 MG PO TABS: 90.0000 mg | ORAL_TABLET | Freq: Two times a day (BID) | ORAL | 12 refills | Status: DC

## 2021-06-05 NOTE — Discharge Summary (Addendum)
Discharge Summary    Patient ID: Carrie Butler MRN: [id number]; DOB: 1960/08/28  Admit date: 06/03/2021 Discharge date: 06/05/2021  PCP:  Pcp, No   [facility] Providers Cardiologist:  [facility]      Discharge Diagnoses    Principal Problem:   Acute ST elevation myocardial infarction (STEMI) of inferior wall ([place]) Active Problems:   Tobacco use   Primary hypertension   Hyperlipidemia LDL goal <70   Carotid artery disease [facility])    Diagnostic Studies/Procedures    Cath 06/03/2021   A stent was successfully placed.   Post intervention, there is a 50% residual stenosis.   Post intervention, there is a 70% residual stenosis.   Acute inferior ST elevation MI due to total mid vessel occlusion Successful complicated procedure requiring guide catheter extension, double wire, and overlapping stenting from distal vessel to the ostium of RCA.  3 stents used.  Residual stenosis of approximately 30% in the mid vessel due to prolapse of tissue at site of stent overlap.  The ostial RCA stent overhangs into the aorta significantly.  Not likely to be able to selectively engage in the future.  Beyond the stented segment there is a angulated 50 to 70% stenosis. Ostial 70 to 80% circumflex.  First obtuse marginal 70% mid.  Mid circumflex 50%. Distal left main 20 to 30%. Ostial LAD 20 to 30%.  30% mid. Moderate inferobasal hypokinesis with EF 40%.   RECOMMENDATIONS:   Aggressive risk factor modification including smoking cessation, and encouraging the patient to use medications for control of blood pressure and cholesterol. High risk for recurrent acute ischemic events due to phenotype and conventional medical therapy refusal  Diagnostic Dominance: Right Intervention   _____________   History of Present Illness     Carrie Butler is a 60 y.o. female with a longstanding history of tobacco use and hypertension who is being seen 06/03/2021 for the evaluation of 18-hour  history of intermittent chest pain that became severe at 4:30 AM and was graded at 10 out of 10 in severity.   Carrie Butler who began having chest discomfort around 6 PM on 06/02/2021.  The discomfort waxed and waned.  At 4:30 AM she began having severe pain that was unremitting.  EMS was summoned.  Upon arrival at [facility] she was complains of 2 out of 10 pain.  Transmitted EKG prior to arrival revealed inferior ST elevation with reciprocal V1 and V2 T wave and ST segment changes.   1 pack/day for greater than 20 years.  Prefers naturopathic therapy to medication management for hypertension.  She uses cannabis for pain control and management of lipids.  She had cannabis this morning at around 7:30 AM.  She feels that blood pressure medications caused her lipid levels to go up.  She is not on any conventional medical therapy.  Hospital Course     Consultants: N/A   Patient was admitted to cardiology service for inferior STEMI.  Symptom waxed and waned for 18 hours prior to admission.  Patient was quite skeptical of medical therapy and prefers naturopathic and cannabis-based therapy.  Given abnormal EKG finding and the troponin, patient was taken urgently to the Cath Lab that showed total occlusion of mid RCA, the vessel was very tortuous.  70% ostial left circumflex artery, 30% left main lesion, EF 40%, LVEDP 14 mmHg.  RCA was treated with 3 overlapping Onyx Frontier drug-eluting stents.  It was felt he is at high risk for recurrent acute ischemic  event due to conventional medical therapy refusal.  Postprocedure, she was placed on aspirin and Brilinta.  It was strongly recommended she stop smoking.  Due to concern of medication compliance, we stressed multiple times the importance of taking medication as directed as well as pursuing aggressive risk factor modification.  LDL during this admission was 158.  Total cholesterol 247.  Multiple blood pressure medication has been added to her medical regimen  including hydralazine, losartan, and metoprolol succinate.  Hemoglobin A1c was 5.7.  Patient was seen in the morning of 06/05/2021 by Dr. Curt Bears at which time she was doing well from the cardiac perspective.  She was deemed stable for discharge.  Carotid Doppler obtained prior to her discharge demonstrated 1 to 39% right ICA stenosis, 80 to 99% left ICA stenosis.  Since patient was asymptomatic, she Wilgus Deyton be discharged with outpatient referral to vascular surgery.  Emphasis has been placed on compliance with dual antiplatelet therapy prior to discharge.  Staff message has been sent to our reasonable office to arrange a 7 to 14-day follow-up for this patient. She Hashir Deleeuw need BMET on follow up given addition of losartan.    Did the patient have an acute coronary syndrome (MI, NSTEMI, STEMI, etc) this admission?:  Yes                               AHA/ACC Clinical Performance & Quality Measures: Aspirin prescribed? - Yes ADP Receptor Inhibitor (Plavix/Clopidogrel, Brilinta/Ticagrelor or Effient/Prasugrel) prescribed (includes medically managed patients)? - Yes Beta Blocker prescribed? - Yes High Intensity Statin (Lipitor 40-80mg  or Crestor 20-40mg ) prescribed? - Yes EF assessed during THIS hospitalization? - Yes For EF <40%, was ACEI/ARB prescribed? - Not Applicable (EF >/= 09%) For EF <40%, Aldosterone Antagonist (Spironolactone or Eplerenone) prescribed? - Not Applicable (EF >/= 62%) Cardiac Rehab Phase II ordered (including medically managed patients)? - Yes       The patient Tamotsu Wiederholt be scheduled for a TOC follow up appointment in 7-14 days.  A message has been sent to the [facility] and Scheduling Pool at the office where the patient should be seen for follow up.  _____________  Discharge Vitals Blood pressure 120/90, pulse 87, temperature 98.5 F (36.9 C), temperature source Oral, resp. rate 18, height 4\' 10"  (1.473 m), weight 33.2 kg, SpO2 99 %.  Filed Weights   06/03/21 1200 06/03/21 1430  06/04/21 2043  Weight: 35.8 kg 35.8 kg 33.2 kg    Labs & Radiologic Studies    CBC Recent Labs    06/03/21 1144 06/03/21 1152 06/04/21 0259  WBC 13.5*  --  12.7*  HGB 13.1 13.9 11.8*  HCT 39.2 41.0 35.1*  MCV 86.9  --  86.7  PLT 381  --  836   Basic Metabolic Panel Recent Labs    06/03/21 1144 06/03/21 1152 06/04/21 0259  NA 137 138 133*  K 3.9 4.0 3.5  CL 108 108 101  CO2 19*  --  23  GLUCOSE 145* 146* 129*  BUN 14 15 16   CREATININE 0.96 0.80 1.10*  CALCIUM 8.8*  --  8.7*   Liver Function Tests Recent Labs    06/03/21 1144  AST 27  ALT 17  ALKPHOS 59  BILITOT 0.7  PROT 6.1*  ALBUMIN 3.2*   No results for input(s): LIPASE, AMYLASE in the last 72 hours. High Sensitivity Troponin:   Recent Labs  Lab 06/03/21 1144  TROPONINIHS 163*  BNP Invalid input(s): POCBNP D-Dimer No results for input(s): DDIMER in the last 72 hours. Hemoglobin A1C Recent Labs    06/04/21 0259  HGBA1C 5.7*   Fasting Lipid Panel Recent Labs    06/03/21 1623  CHOL 247*  HDL 79  LDLCALC 158*  TRIG 49  CHOLHDL 3.1   Thyroid Function Tests No results for input(s): TSH, T4TOTAL, T3FREE, THYROIDAB in the last 72 hours.  Invalid input(s): FREET3 _____________  CARDIAC CATHETERIZATION  Result Date: 06/03/2021   A stent was successfully placed.   Post intervention, there is a 50% residual stenosis.   Post intervention, there is a 70% residual stenosis. Acute inferior ST elevation MI due to total mid vessel occlusion Successful complicated procedure requiring guide catheter extension, double wire, and overlapping stenting from distal vessel to the ostium of RCA.  3 stents used.  Residual stenosis of approximately 30% in the mid vessel due to prolapse of tissue at site of stent overlap.  The ostial RCA stent overhangs into the aorta significantly.  Not likely to be able to selectively engage in the future.  Beyond the stented segment there is a angulated 50 to 70% stenosis.  Ostial 70 to 80% circumflex.  First obtuse marginal 70% mid.  Mid circumflex 50%. Distal left main 20 to 30%. Ostial LAD 20 to 30%.  30% mid. Moderate inferobasal hypokinesis with EF 40%. RECOMMENDATIONS: Aggressive risk factor modification including smoking cessation, and encouraging the patient to use medications for control of blood pressure and cholesterol. High risk for recurrent acute ischemic events due to phenotype and conventional medical therapy refusal   DG CHEST PORT 1 VIEW  Result Date: 06/03/2021 CLINICAL DATA:  Shortness of breath EXAM: PORTABLE CHEST 1 VIEW COMPARISON:  None. FINDINGS: The heart size and mediastinal contours are within normal limits. Both lungs are clear. The visualized skeletal structures are unremarkable. Bilateral nipple shadows are noted. IMPRESSION: No active disease. Electronically Signed   By: Inez Catalina M.D.   On: 06/03/2021 19:15   VAS US CAROTID  Result Date: 06/05/2021 Carotid Arterial Duplex Study Patient Name:  SHIVANI BARRANTES [facility]  Date of Exam:   06/05/2021 Medical Rec #: [id number]          Accession #:    [id number] Date of Birth: 1961/03/18           Patient Gender: F Patient Age:   60 years Exam Location:  [facility] Procedure:      VAS US CAROTID Referring Phys: Ria Comment ROBERTS --------------------------------------------------------------------------------  Indications:       Left bruit. Risk Factors:      Hypertension, hyperlipidemia, current smoker. Comparison Study:  No prior studies. Performing Technologist: Oliver Hum RVT  Examination Guidelines: A complete evaluation includes B-mode imaging, spectral Doppler, color Doppler, and power Doppler as needed of all accessible portions of each vessel. Bilateral testing is considered an integral part of a complete examination. Limited examinations for reoccurring indications may be performed as noted.  Right Carotid Findings:  +----------+--------+--------+--------+-----------------------+--------+             PSV cm/s EDV cm/s Stenosis Plaque Description      Comments  +----------+--------+--------+--------+-----------------------+--------+  CCA Prox   101      19                smooth and heterogenous           +----------+--------+--------+--------+-----------------------+--------+  CCA Distal 81       25  smooth and heterogenous           +----------+--------+--------+--------+-----------------------+--------+  ICA Prox   138      32                calcific                          +----------+--------+--------+--------+-----------------------+--------+  ICA Distal 66       23                                                  +----------+--------+--------+--------+-----------------------+--------+  ECA        163      20                                                  +----------+--------+--------+--------+-----------------------+--------+ +----------+--------+-------+--------+-------------------+             PSV cm/s EDV cms Describe Arm Pressure (mmHG)  +----------+--------+-------+--------+-------------------+  Subclavian 143                                            +----------+--------+-------+--------+-------------------+ +---------+--------+--+--------+--+---------+  Vertebral PSV cm/s 26 EDV cm/s 11 Antegrade  +---------+--------+--+--------+--+---------+  Left Carotid Findings: +----------+--------+--------+--------+-----------------------+--------+             PSV cm/s EDV cm/s Stenosis Plaque Description      Comments  +----------+--------+--------+--------+-----------------------+--------+  CCA Prox   72       19                smooth and heterogenous           +----------+--------+--------+--------+-----------------------+--------+  CCA Distal 67       31                smooth and heterogenous           +----------+--------+--------+--------+-----------------------+--------+  ICA Prox   139      66                 calcific                          +----------+--------+--------+--------+-----------------------+--------+  ICA Mid    257      124               calcific                          +----------+--------+--------+--------+-----------------------+--------+  ICA Distal 74       31                                                  +----------+--------+--------+--------+-----------------------+--------+  ECA        325      45                                                  +----------+--------+--------+--------+-----------------------+--------+ +----------+--------+--------+--------+-------------------+  PSV cm/s EDV cm/s Describe Arm Pressure (mmHG)  +----------+--------+--------+--------+-------------------+  Subclavian 168                                             +----------+--------+--------+--------+-------------------+ +---------+--------+--+--------+--+---------+  Vertebral PSV cm/s 17 EDV cm/s 10 Antegrade  +---------+--------+--+--------+--+---------+   Summary: Right Carotid: Velocities in the right ICA are consistent with a 1-39% stenosis. Left Carotid: Velocities in the left ICA are consistent with a 80-99% stenosis. Vertebrals: Bilateral vertebral arteries demonstrate antegrade flow. *See table(s) above for measurements and observations.     Preliminary    Disposition   Pt is being discharged home today in good condition.  Follow-up Plans & Appointments     Follow-up Information     [facility] at [facility] Follow up.   Specialty: Cardiology Why: cardiology scheduler Melayah Skorupski contact you to arrange a close outpatient follow up, please give [place] a call if you do not hear from our scheduler in 3 business days. Contact information: [address] [place] [address] [phone]               Discharge Instructions     Amb Referral to Cardiac Rehabilitation   Complete by: As directed    Diagnosis:  Coronary Stents STEMI PTCA     After initial  evaluation and assessments completed: Virtual Based Care may be provided alone or in conjunction with Phase 2 Cardiac Rehab based on patient barriers.: Yes   Diet - low sodium heart healthy   Complete by: As directed    Discharge instructions   Complete by: As directed    No lifting over 5 lbs for 1 week. No sexual activity for 1 week. Keep procedure site clean & dry. If you notice increased pain, swelling, bleeding or pus, call/return!  You may shower, but no soaking baths/hot tubs/pools for 1 week.   Increase activity slowly   Complete by: As directed        Discharge Medications   Allergies as of 06/05/2021   No Known Allergies      Medication List     TAKE these medications    aspirin 81 MG chewable tablet Chew 1 tablet (81 mg total) by mouth daily. Start taking on: June 06, 2021   atorvastatin 80 MG tablet Commonly known as: LIPITOR Take 1 tablet (80 mg total) by mouth daily.   hydrALAZINE 25 MG tablet Commonly known as: APRESOLINE Take 1 tablet (25 mg total) by mouth every 8 (eight) hours.   losartan 50 MG tablet Commonly known as: COZAAR Take 1 tablet (50 mg total) by mouth daily. Start taking on: June 06, 2021   metoprolol succinate 50 MG 24 hr tablet Commonly known as: TOPROL-XL Take 1 tablet (50 mg total) by mouth daily. Take with or immediately following a meal. Start taking on: June 06, 2021   nitroGLYCERIN 0.4 MG SL tablet Commonly known as: Nitrostat Place 1 tablet (0.4 mg total) under the tongue every 5 (five) minutes as needed for chest pain.   ticagrelor 90 MG Tabs tablet Commonly known as: BRILINTA Take 1 tablet (90 mg total) by mouth 2 (two) times daily.           Outstanding Labs/Studies   BMET on follow up.  Duration of Discharge Encounter   Greater than 30 minutes including physician time.  Hilbert Corrigan, PA 06/05/2021, 1:48  PM   I have seen and examined this patient with Almyra Deforest.  Agree with above, note  added to reflect my findings.  On exam, regular rhythm, no murmurs, lungs clear, no edema, no JVD.  Patient presented to the hospital with STEMI status post RCA stent.  Chest pain-free currently.  We Lynk Marti plan for discharge today.  She did have a carotid ultrasound with severe disease and has follow-up planned with vascular surgery.  Greater than 30 minutes were spent in the discharge of this patient.   Renada Cronin M. Leemon Ayala MD 06/06/2021 11:02 AM

## 2021-06-05 NOTE — Progress Notes (Signed)
Progress Note  Patient Name: Carrie Butler Date of Encounter: 06/05/2021  [facility] Cardiologist: Dr. Tamala Julian  Subjective   Postop day 2 from inferior STEMI treated with PCI and stenting to the mid and proximal RCA.  Has moderate left-sided disease treated medically.  LV function preserved.  Ready to return home.   Inpatient Medications    Scheduled Meds:  aspirin  81 mg Oral Daily   atorvastatin  80 mg Oral Daily   Chlorhexidine Gluconate Cloth  6 each Topical Daily   enoxaparin (LOVENOX) injection  30 mg Subcutaneous Q24H   hydrALAZINE  25 mg Oral Q8H   losartan  50 mg Oral Daily   metoprolol succinate  25 mg Oral Daily   nitroGLYCERIN  1 inch Topical Q6H   sodium chloride flush  3 mL Intravenous Q12H   ticagrelor  90 mg Oral BID   Continuous Infusions:  sodium chloride     PRN Meds: sodium chloride, acetaminophen, hydrALAZINE, LORazepam, ondansetron (ZOFRAN) IV, oxyCODONE, sodium chloride flush   Vital Signs    Vitals:   06/04/21 2150 06/05/21 0616 06/05/21 0618 06/05/21 0812  BP: 93/72 135/67 135/67 123/68  Pulse: 91 93  84  Resp:  18  20  Temp:  98.4 F (36.9 C)  98.5 F (36.9 C)  TempSrc:  Oral  Oral  SpO2: 99% 99%    Weight:      Height:        Intake/Output Summary (Last 24 hours) at 06/05/2021 0949 Last data filed at 06/05/2021 0400 Gross per 24 hour  Intake 504.69 ml  Output 200 ml  Net 304.69 ml    Last 3 Weights 06/04/2021 06/03/2021 06/03/2021  Weight (lbs) 73 lb 1.6 oz 78 lb 15.9 oz 78 lb 15.9 oz  Weight (kg) 33.158 kg 35.83 kg 35.83 kg      Telemetry    Sinus rhythm with sinus tachycardia-personally reviewed   ECG    None new  Physical Exam   GEN: Well nourished, well developed, in no acute distress  HEENT: normal  Neck: no JVD, carotid bruits, or masses Cardiac: RRR; no murmurs, rubs, or gallops,no edema  Respiratory:  clear to auscultation bilaterally, normal work of breathing GI: soft, nontender, nondistended, +  BS MS: no deformity or atrophy  Skin: warm and dry Neuro:  Strength and sensation are intact Psych: euthymic mood, full affect   Labs    High Sensitivity Troponin:   Recent Labs  Lab 06/03/21 1144  TROPONINIHS 163*      Chemistry Recent Labs  Lab 06/03/21 1144 06/03/21 1152 06/04/21 0259  NA 137 138 133*  K 3.9 4.0 3.5  CL 108 108 101  CO2 19*  --  23  GLUCOSE 145* 146* 129*  BUN 14 15 16   CREATININE 0.96 0.80 1.10*  CALCIUM 8.8*  --  8.7*  PROT 6.1*  --   --   ALBUMIN 3.2*  --   --   AST 27  --   --   ALT 17  --   --   ALKPHOS 59  --   --   BILITOT 0.7  --   --   GFRNONAA >60  --  58*  ANIONGAP 10  --  9     Lipids  Recent Labs  Lab 06/03/21 1623  CHOL 247*  TRIG 49  HDL 79  LDLCALC 158*  CHOLHDL 3.1     Hematology Recent Labs  Lab 06/03/21 1144 06/03/21 1152 06/04/21 0259  WBC 13.5*  --  12.7*  RBC 4.51  --  4.05  HGB 13.1 13.9 11.8*  HCT 39.2 41.0 35.1*  MCV 86.9  --  86.7  MCH 29.0  --  29.1  MCHC 33.4  --  33.6  RDW 14.1  --  14.2  PLT 381  --  344    Thyroid No results for input(s): TSH, FREET4 in the last 168 hours.  BNPNo results for input(s): BNP, PROBNP in the last 168 hours.  DDimer No results for input(s): DDIMER in the last 168 hours.   Radiology    CARDIAC CATHETERIZATION  Result Date: 06/03/2021   A stent was successfully placed.   Post intervention, there is a 50% residual stenosis.   Post intervention, there is a 70% residual stenosis. Acute inferior ST elevation MI due to total mid vessel occlusion Successful complicated procedure requiring guide catheter extension, double wire, and overlapping stenting from distal vessel to the ostium of RCA.  3 stents used.  Residual stenosis of approximately 30% in the mid vessel due to prolapse of tissue at site of stent overlap.  The ostial RCA stent overhangs into the aorta significantly.  Not likely to be able to selectively engage in the future.  Beyond the stented segment there is  a angulated 50 to 70% stenosis. Ostial 70 to 80% circumflex.  First obtuse marginal 70% mid.  Mid circumflex 50%. Distal left main 20 to 30%. Ostial LAD 20 to 30%.  30% mid. Moderate inferobasal hypokinesis with EF 40%. RECOMMENDATIONS: Aggressive risk factor modification including smoking cessation, and encouraging the patient to use medications for control of blood pressure and cholesterol. High risk for recurrent acute ischemic events due to phenotype and conventional medical therapy refusal   DG CHEST PORT 1 VIEW  Result Date: 06/03/2021 CLINICAL DATA:  Shortness of breath EXAM: PORTABLE CHEST 1 VIEW COMPARISON:  None. FINDINGS: The heart size and mediastinal contours are within normal limits. Both lungs are clear. The visualized skeletal structures are unremarkable. Bilateral nipple shadows are noted. IMPRESSION: No active disease. Electronically Signed   By: Inez Catalina M.D.   On: 06/03/2021 19:15    Cardiac Studies   Cardiac catheterization/PCI drug-eluting stent RCA (06/03/2021)  Conclusion      A stent was successfully placed.   Post intervention, there is a 50% residual stenosis.   Post intervention, there is a 70% residual stenosis.   Acute inferior ST elevation MI due to total mid vessel occlusion Successful complicated procedure requiring guide catheter extension, double wire, and overlapping stenting from distal vessel to the ostium of RCA.  3 stents used.  Residual stenosis of approximately 30% in the mid vessel due to prolapse of tissue at site of stent overlap.  The ostial RCA stent overhangs into the aorta significantly.  Not likely to be able to selectively engage in the future.  Beyond the stented segment there is a angulated 50 to 70% stenosis. Ostial 70 to 80% circumflex.  First obtuse marginal 70% mid.  Mid circumflex 50%. Distal left main 20 to 30%. Ostial LAD 20 to 30%.  30% mid. Moderate inferobasal hypokinesis with EF 40%.   RECOMMENDATIONS:   Aggressive risk  factor modification including smoking cessation, and encouraging the patient to use medications for control of blood pressure and cholesterol. High risk for recurrent acute ischemic events due to phenotype and conventional medical therapy refusal  Coronary Diagrams  Diagnostic Dominance: Right Intervention    Patient Profile     60  y.o. female with a history of hypertension and tobacco abuse who was admitted with inferior STEMI transfer from [facility].  She underwent urgent cardiac catheterization via the right radial approach by Dr. Tamala Julian revealing an occluded proximal dominant RCA.  The vessel was tortuous revascularization was technically challenging.  She had 3 overlapping drug-eluting stents from the mid to distal vessel out to the ostium with slight extension beyond the ostium .  She did have mild to moderate distal left main and high-grade ostial circumflex disease with preserved LV function.  She is asymptomatic this morning.  Assessment & Plan    1.  Inferior STEMI: Postop day 2 treated with drug-eluting stents to the RCA.  Myrel Rappleye need to 12 months of uninterrupted dual antiplatelet therapy with aspirin and Brilinta.  We Jaina Morin also need high-dose statin at discharge.  She does have other coronary artery disease but Allahna Husband be managed medically.  2.  Hypertension: Continue beta-blocker and losartan  3.  Hyperlipidemia: LDL 158.  Continue high-dose statin with goal LDL less than 70.  4.  Tobacco abuse: Complete cessation encouraged  Ready for discharge today once carotid ultrasound has been performed.  For questions or updates, please contact [place] Please consult [email or web address] for contact info under        Signed, Jabreel Chimento Meredith Leeds, MD  06/05/2021, 9:49 AM

## 2021-06-05 NOTE — Progress Notes (Signed)
Carotid artery duplex has been completed. Preliminary results can be found in CV Proc through chart review.   06/05/21 12:48 PM Carrie Butler RVT

## 2021-06-07 ENCOUNTER — Emergency Department (HOSPITAL_COMMUNITY): Payer: BLUE CROSS/BLUE SHIELD

## 2021-06-07 ENCOUNTER — Inpatient Hospital Stay (HOSPITAL_COMMUNITY)
Admission: EM | Disposition: A | Discharge status: Home / Self Care | Payer: Self-pay | Source: Home / Self Care | Attending: Cardiology

## 2021-06-07 ENCOUNTER — Inpatient Hospital Stay (HOSPITAL_COMMUNITY): Payer: BLUE CROSS/BLUE SHIELD

## 2021-06-07 ENCOUNTER — Other Ambulatory Visit: Payer: Self-pay

## 2021-06-07 ENCOUNTER — Inpatient Hospital Stay (HOSPITAL_COMMUNITY)
Admission: EM | Admit: 2021-06-07 | Discharge: 2021-06-10 | Disposition: A | Discharge status: Home / Self Care | Payer: BLUE CROSS/BLUE SHIELD | Source: Home / Self Care | Attending: Cardiology | Admitting: Cardiology

## 2021-06-07 ENCOUNTER — Encounter (HOSPITAL_COMMUNITY): Payer: Self-pay | Admitting: Student

## 2021-06-07 DIAGNOSIS — Z720 Tobacco use: Secondary | ICD-10-CM | POA: Diagnosis present

## 2021-06-07 DIAGNOSIS — I2119 ST elevation (STEMI) myocardial infarction involving other coronary artery of inferior wall: Secondary | ICD-10-CM | POA: Diagnosis present

## 2021-06-07 DIAGNOSIS — E876 Hypokalemia: Secondary | ICD-10-CM

## 2021-06-07 DIAGNOSIS — D72828 Other elevated white blood cell count: Secondary | ICD-10-CM | POA: Diagnosis present

## 2021-06-07 DIAGNOSIS — Z955 Presence of coronary angioplasty implant and graft: Secondary | ICD-10-CM

## 2021-06-07 DIAGNOSIS — I221 Subsequent ST elevation (STEMI) myocardial infarction of inferior wall: Secondary | ICD-10-CM | POA: Diagnosis present

## 2021-06-07 DIAGNOSIS — F17210 Nicotine dependence, cigarettes, uncomplicated: Secondary | ICD-10-CM | POA: Diagnosis present

## 2021-06-07 DIAGNOSIS — E785 Hyperlipidemia, unspecified: Secondary | ICD-10-CM | POA: Diagnosis present

## 2021-06-07 DIAGNOSIS — R079 Chest pain, unspecified: Secondary | ICD-10-CM

## 2021-06-07 DIAGNOSIS — R1310 Dysphagia, unspecified: Secondary | ICD-10-CM

## 2021-06-07 DIAGNOSIS — I10 Essential (primary) hypertension: Secondary | ICD-10-CM | POA: Diagnosis present

## 2021-06-07 DIAGNOSIS — Z681 Body mass index (BMI) 19 or less, adult: Secondary | ICD-10-CM

## 2021-06-07 DIAGNOSIS — D72829 Elevated white blood cell count, unspecified: Secondary | ICD-10-CM

## 2021-06-07 DIAGNOSIS — F419 Anxiety disorder, unspecified: Secondary | ICD-10-CM | POA: Diagnosis present

## 2021-06-07 DIAGNOSIS — R64 Cachexia: Secondary | ICD-10-CM | POA: Diagnosis present

## 2021-06-07 DIAGNOSIS — I214 Non-ST elevation (NSTEMI) myocardial infarction: Secondary | ICD-10-CM

## 2021-06-07 DIAGNOSIS — I6522 Occlusion and stenosis of left carotid artery: Secondary | ICD-10-CM | POA: Diagnosis present

## 2021-06-07 DIAGNOSIS — I25110 Atherosclerotic heart disease of native coronary artery with unstable angina pectoris: Secondary | ICD-10-CM | POA: Diagnosis present

## 2021-06-07 DIAGNOSIS — Z8249 Family history of ischemic heart disease and other diseases of the circulatory system: Secondary | ICD-10-CM

## 2021-06-07 DIAGNOSIS — I6523 Occlusion and stenosis of bilateral carotid arteries: Secondary | ICD-10-CM | POA: Diagnosis present

## 2021-06-07 DIAGNOSIS — Z20822 Contact with and (suspected) exposure to covid-19: Secondary | ICD-10-CM | POA: Diagnosis present

## 2021-06-07 HISTORY — PX: LEFT HEART CATH AND CORONARY ANGIOGRAPHY: CATH118249

## 2021-06-07 HISTORY — PX: CORONARY/GRAFT ACUTE MI REVASCULARIZATION: CATH118305

## 2021-06-07 HISTORY — DX: Non-ST elevation (NSTEMI) myocardial infarction: I21.4

## 2021-06-07 LAB — CBC WITH DIFFERENTIAL/PLATELET
Abs Immature Granulocytes: 0.08 10*3/uL — ABNORMAL HIGH (ref 0.00–0.07)
Basophils Absolute: 0 10*3/uL (ref 0.0–0.1)
Basophils Relative: 0 %
Eosinophils Absolute: 0.1 10*3/uL (ref 0.0–0.5)
Eosinophils Relative: 1 %
HCT: 43.1 % (ref 36.0–46.0)
Hemoglobin: 14.4 g/dL (ref 12.0–15.0)
Immature Granulocytes: 1 %
Lymphocytes Relative: 9 %
Lymphs Abs: 1.5 10*3/uL (ref 0.7–4.0)
MCH: 29.6 pg (ref 26.0–34.0)
MCHC: 33.4 g/dL (ref 30.0–36.0)
MCV: 88.7 fL (ref 80.0–100.0)
Monocytes Absolute: 0.9 10*3/uL (ref 0.1–1.0)
Monocytes Relative: 5 %
Neutro Abs: 14.6 10*3/uL — ABNORMAL HIGH (ref 1.7–7.7)
Neutrophils Relative %: 84 %
Platelets: 278 10*3/uL (ref 150–400)
RBC: 4.86 MIL/uL (ref 3.87–5.11)
RDW: 14 % (ref 11.5–15.5)
WBC: 17.1 10*3/uL — ABNORMAL HIGH (ref 4.0–10.5)
nRBC: 0 % (ref 0.0–0.2)

## 2021-06-07 LAB — TROPONIN I (HIGH SENSITIVITY)
Troponin I (High Sensitivity): 4438 ng/L (ref <18)
Troponin I (High Sensitivity): 3851 ng/L (ref <18)
Troponin I (High Sensitivity): 3905 ng/L (ref <18)
Troponin I (High Sensitivity): 4249 ng/L (ref <18)

## 2021-06-07 LAB — ECHOCARDIOGRAM COMPLETE
AR max vel: 2.23 cm2
AV Area VTI: 1.83 cm2
AV Area mean vel: 1.85 cm2
AV Mean grad: 3 mmHg
AV Peak grad: 5.3 mmHg
Ao pk vel: 1.15 m/s
Area-P 1/2: 2.38 cm2
Calc EF: 52.5 %
Height: 58 in
MV VTI: 1.67 cm2
S' Lateral: 2 cm
Single Plane A2C EF: 52.7 %
Single Plane A4C EF: 50 %
Weight: 1171.08 oz

## 2021-06-07 LAB — COMPREHENSIVE METABOLIC PANEL
ALT: 38 U/L (ref 0–44)
AST: 69 U/L — ABNORMAL HIGH (ref 15–41)
Albumin: 3.7 g/dL (ref 3.5–5.0)
Alkaline Phosphatase: 66 U/L (ref 38–126)
Anion gap: 14 (ref 5–15)
BUN: 24 mg/dL — ABNORMAL HIGH (ref 6–20)
CO2: 19 mmol/L — ABNORMAL LOW (ref 22–32)
Calcium: 9.3 mg/dL (ref 8.9–10.3)
Chloride: 102 mmol/L (ref 98–111)
Creatinine, Ser: 1.18 mg/dL — ABNORMAL HIGH (ref 0.44–1.00)
GFR, Estimated: 54 mL/min — ABNORMAL LOW (ref >60)
Glucose, Bld: 116 mg/dL — ABNORMAL HIGH (ref 70–99)
Potassium: 3 mmol/L — ABNORMAL LOW (ref 3.5–5.1)
Sodium: 135 mmol/L (ref 135–145)
Total Bilirubin: 0.7 mg/dL (ref 0.3–1.2)
Total Protein: 7 g/dL (ref 6.5–8.1)

## 2021-06-07 LAB — RESP PANEL BY RT-PCR (FLU A&B, COVID) ARPGX2
Influenza A by PCR: NEGATIVE
Influenza B by PCR: NEGATIVE
SARS Coronavirus 2 by RT PCR: NEGATIVE

## 2021-06-07 SURGERY — CORONARY/GRAFT ACUTE MI REVASCULARIZATION
Anesthesia: LOCAL

## 2021-06-07 MED ORDER — METOPROLOL SUCCINATE ER 50 MG PO TB24
75.0000 mg | ORAL_TABLET | Freq: Every day | ORAL | Status: DC
  Administered 2021-06-07 – 2021-06-08 (×2): 75 mg via ORAL
  Filled 2021-06-07 (×2): qty 1

## 2021-06-07 MED ORDER — ONDANSETRON HCL 4 MG/2ML IJ SOLN: 4.0000 mg | Freq: Four times a day (QID) | INTRAMUSCULAR | Status: DC | PRN

## 2021-06-07 MED ORDER — ASPIRIN 81 MG PO CHEW: 81.0000 mg | CHEWABLE_TABLET | Freq: Every day | ORAL | Status: DC

## 2021-06-07 MED ORDER — ACETAMINOPHEN 325 MG PO TABS: 650.0000 mg | ORAL_TABLET | ORAL | Status: DC | PRN

## 2021-06-07 MED ORDER — DIAZEPAM 2 MG PO TABS
2.0000 mg | ORAL_TABLET | Freq: Once | ORAL | Status: AC
  Administered 2021-06-07: 09:00:00 2 mg via ORAL
  Filled 2021-06-07: qty 1

## 2021-06-07 MED ORDER — HEPARIN BOLUS VIA INFUSION
2000.0000 [IU] | Freq: Once | INTRAVENOUS | Status: DC
  Filled 2021-06-07: qty 2000

## 2021-06-07 MED ORDER — SODIUM CHLORIDE 0.9% FLUSH
3.0000 mL | Freq: Two times a day (BID) | INTRAVENOUS | Status: DC
  Administered 2021-06-07 – 2021-06-09 (×4): 3 mL via INTRAVENOUS

## 2021-06-07 MED ORDER — LOSARTAN POTASSIUM 50 MG PO TABS
50.0000 mg | ORAL_TABLET | Freq: Every day | ORAL | Status: DC
  Administered 2021-06-08 – 2021-06-10 (×3): 50 mg via ORAL
  Filled 2021-06-07 (×3): qty 1

## 2021-06-07 MED ORDER — CHLORHEXIDINE GLUCONATE CLOTH 2 % EX PADS
6.0000 | MEDICATED_PAD | Freq: Every day | CUTANEOUS | Status: DC
  Administered 2021-06-07 – 2021-06-09 (×3): 6 via TOPICAL

## 2021-06-07 MED ORDER — NITROGLYCERIN 0.4 MG SL SUBL
0.4000 mg | SUBLINGUAL_TABLET | SUBLINGUAL | Status: DC | PRN
  Administered 2021-06-07: 09:00:00 0.4 mg via SUBLINGUAL
  Filled 2021-06-07 (×2): qty 1

## 2021-06-07 MED ORDER — SODIUM CHLORIDE 0.9 % WEIGHT BASED INFUSION: 1.0000 mL/kg/h | INTRAVENOUS | Status: DC

## 2021-06-07 MED ORDER — SODIUM CHLORIDE 0.9% FLUSH: 3.0000 mL | INTRAVENOUS | Status: DC | PRN

## 2021-06-07 MED ORDER — HEPARIN (PORCINE) IN NACL 1000-0.9 UT/500ML-% IV SOLN
INTRAVENOUS | Status: AC
  Filled 2021-06-07: qty 1000

## 2021-06-07 MED ORDER — SODIUM CHLORIDE 0.9 % IV SOLN: INTRAVENOUS | Status: AC

## 2021-06-07 MED ORDER — ASPIRIN 81 MG PO CHEW: 81.0000 mg | CHEWABLE_TABLET | ORAL | Status: DC

## 2021-06-07 MED ORDER — ASPIRIN 300 MG RE SUPP: 300.0000 mg | RECTAL | Status: DC

## 2021-06-07 MED ORDER — POTASSIUM CHLORIDE CRYS ER 20 MEQ PO TBCR
40.0000 meq | EXTENDED_RELEASE_TABLET | Freq: Once | ORAL | Status: AC
  Administered 2021-06-07: 09:00:00 40 meq via ORAL
  Filled 2021-06-07: qty 2

## 2021-06-07 MED ORDER — LIDOCAINE HCL (PF) 1 % IJ SOLN
INTRAMUSCULAR | Status: AC
  Filled 2021-06-07: qty 30

## 2021-06-07 MED ORDER — HEPARIN SODIUM (PORCINE) 5000 UNIT/ML IJ SOLN
2000.0000 [IU] | Freq: Once | INTRAMUSCULAR | Status: AC
  Administered 2021-06-07: 09:00:00 2000 [IU] via INTRAVENOUS

## 2021-06-07 MED ORDER — TICAGRELOR 90 MG PO TABS
90.0000 mg | ORAL_TABLET | Freq: Two times a day (BID) | ORAL | Status: DC
  Administered 2021-06-07 – 2021-06-10 (×7): 90 mg via ORAL
  Filled 2021-06-07 (×7): qty 1

## 2021-06-07 MED ORDER — SODIUM CHLORIDE 0.9 % IV SOLN: 250.0000 mL | INTRAVENOUS | Status: DC | PRN

## 2021-06-07 MED ORDER — ASPIRIN EC 81 MG PO TBEC
81.0000 mg | DELAYED_RELEASE_TABLET | Freq: Every day | ORAL | Status: DC
  Administered 2021-06-08 – 2021-06-10 (×3): 81 mg via ORAL
  Filled 2021-06-07 (×3): qty 1

## 2021-06-07 MED ORDER — ATORVASTATIN CALCIUM 80 MG PO TABS
80.0000 mg | ORAL_TABLET | Freq: Every day | ORAL | Status: DC
  Administered 2021-06-07 – 2021-06-10 (×4): 80 mg via ORAL
  Filled 2021-06-07 (×4): qty 1

## 2021-06-07 MED ORDER — IOHEXOL 350 MG/ML SOLN
INTRAVENOUS | Status: DC | PRN
  Administered 2021-06-07: 10:00:00 45 mL

## 2021-06-07 MED ORDER — HEPARIN (PORCINE) IN NACL 1000-0.9 UT/500ML-% IV SOLN
INTRAVENOUS | Status: DC | PRN
  Administered 2021-06-07 (×2): 500 mL

## 2021-06-07 MED ORDER — HEPARIN SODIUM (PORCINE) 5000 UNIT/ML IJ SOLN
5000.0000 [IU] | Freq: Three times a day (TID) | INTRAMUSCULAR | Status: DC
  Administered 2021-06-07 – 2021-06-10 (×8): 5000 [IU] via SUBCUTANEOUS
  Filled 2021-06-07 (×8): qty 1

## 2021-06-07 MED ORDER — LIDOCAINE HCL (PF) 1 % IJ SOLN
INTRAMUSCULAR | Status: DC | PRN
  Administered 2021-06-07: 10 mL

## 2021-06-07 MED ORDER — SODIUM CHLORIDE 0.9 % WEIGHT BASED INFUSION: 3.0000 mL/kg/h | INTRAVENOUS | Status: DC

## 2021-06-07 MED ORDER — ASPIRIN 81 MG PO CHEW: 324.0000 mg | CHEWABLE_TABLET | ORAL | Status: DC

## 2021-06-07 MED ORDER — ISOSORBIDE MONONITRATE ER 30 MG PO TB24
15.0000 mg | ORAL_TABLET | Freq: Every day | ORAL | Status: DC
  Administered 2021-06-07 – 2021-06-08 (×2): 15 mg via ORAL
  Filled 2021-06-07 (×2): qty 1

## 2021-06-07 MED ORDER — HYDRALAZINE HCL 25 MG PO TABS
25.0000 mg | ORAL_TABLET | Freq: Three times a day (TID) | ORAL | Status: DC
  Administered 2021-06-07 – 2021-06-09 (×6): 25 mg via ORAL
  Filled 2021-06-07 (×6): qty 1

## 2021-06-07 MED ORDER — NITROGLYCERIN 0.4 MG SL SUBL
0.4000 mg | SUBLINGUAL_TABLET | SUBLINGUAL | Status: DC | PRN
  Administered 2021-06-09 (×2): 0.4 mg via SUBLINGUAL
  Filled 2021-06-07 (×2): qty 1

## 2021-06-07 MED ORDER — HEPARIN (PORCINE) 25000 UT/250ML-% IV SOLN
400.0000 [IU]/h | INTRAVENOUS | Status: DC
  Administered 2021-06-07: 09:00:00 400 [IU]/h via INTRAVENOUS
  Filled 2021-06-07: qty 250

## 2021-06-07 MED ORDER — HYDRALAZINE HCL 20 MG/ML IJ SOLN: 10.0000 mg | INTRAMUSCULAR | Status: AC | PRN

## 2021-06-07 MED ORDER — SODIUM CHLORIDE 0.9% FLUSH
3.0000 mL | Freq: Two times a day (BID) | INTRAVENOUS | Status: DC
  Administered 2021-06-07 – 2021-06-10 (×6): 3 mL via INTRAVENOUS

## 2021-06-07 SURGICAL SUPPLY — 12 items
CATH INFINITI 5 FR IM (CATHETERS) ×2 IMPLANT
CATH INFINITI 5FR JL4 (CATHETERS) ×2 IMPLANT
CATH INFINITI JR4 5F (CATHETERS) ×2 IMPLANT
ELECT DEFIB PAD ADLT CADENCE (PAD) ×2 IMPLANT
KIT ENCORE 26 ADVANTAGE (KITS) ×2 IMPLANT
KIT HEART LEFT (KITS) ×3 IMPLANT
PACK CARDIAC CATHETERIZATION (CUSTOM PROCEDURE TRAY) ×3 IMPLANT
SHEATH PINNACLE 6F 10CM (SHEATH) ×2 IMPLANT
SYR MEDRAD MARK 7 150ML (SYRINGE) ×3 IMPLANT
TRANSDUCER W/STOPCOCK (MISCELLANEOUS) ×3 IMPLANT
TUBING CIL FLEX 10 FLL-RA (TUBING) ×3 IMPLANT
WIRE EMERALD 3MM-J .035X150CM (WIRE) ×2 IMPLANT

## 2021-06-07 NOTE — Interval H&P Note (Signed)
History and Physical Interval Note:  06/07/2021 9:20 AM  Carrie Butler  has presented today for surgery, with the diagnosis of STEMI.  The various methods of treatment have been discussed with the patient and family. After consideration of risks, benefits and other options for treatment, the patient has consented to  Procedure(s): Coronary/Graft Acute MI Revascularization (N/A) LEFT HEART CATH AND CORONARY ANGIOGRAPHY (N/A) as a surgical intervention.  The patient's history has been reviewed, patient examined, no change in status, stable for surgery.  I have reviewed the patient's chart and labs.  Questions were answered to the patient's satisfaction.   Cath Lab Visit (complete for each Cath Lab visit)  Clinical Evaluation Leading to the Procedure:   ACS: Yes.    Non-ACS:    Anginal Classification: CCS IV  Anti-ischemic medical therapy: Minimal Therapy (1 class of medications)  Non-Invasive Test Results: No non-invasive testing performed  Prior CABG: No previous CABG        Carrie Butler [facility] 06/07/2021 9:20 AM

## 2021-06-07 NOTE — ED Provider Notes (Signed)
I personally evaluated the patient during the encounter and completed a history, physical, procedures, medical decision making to contribute to the overall care of the patient and decision making for the patient briefly, the patient is a 60 y.o. female chest pain.  Vitals unremarkable.  No fever.  Chest pain started earlier this evening.  Took nitroglycerin with minimal relief.  Had a stent placed several days ago here.  Has been chest pain-free until overnight.  Had fairly significant disease on cath and had 3 stents placed.  Patient with EKG that shows inferior leads with ST depression.  Lateral leads with ST changes that appear consistent with prior EKG.  Troponin is 4500.  Lab work otherwise unremarkable.  Cardiology has been consulted.  We will start her on heparin and admit to cardiology service.  This chart was dictated using voice recognition software.  Despite best efforts to proofread,  errors can occur which can change the documentation meaning.    EKG Interpretation  Date/Time:  Monday June 07 2021 06:28:34 EST Ventricular Rate:  98 PR Interval:  124 QRS Duration: 86 QT Interval:  340 QTC Calculation: 434 R Axis:   87 Text Interpretation: Normal sinus rhythm Biatrial enlargement Left ventricular hypertrophy with repolarization abnormality ( Sokolow-Lyon , Romhilt-Estes ) Anterior infarct , age undetermined Abnormal ECG New since previous tracing Confirmed by Orpah Greek [phone]) on 06/07/2021 6:38:45 AM            Lennice Sites, DO 06/07/21 [phone]

## 2021-06-07 NOTE — H&P (Addendum)
Cardiology Admission History and Physical:   Patient ID: NEA GITTENS MRN: [id number]; DOB: February 18, 1961   Admission date: 06/07/2021  PCP:  Default, Provider, MD   [facility] Providers Cardiologist:  Malen Gauze   Chief Complaint:  Chest pressure and SOB  Patient Profile:   Carrie Butler is a 60 y.o. female with past medical history of tobacco abuse and hypertension who is being seen 06/07/2021 for the evaluation of chest pressure and SOB that woke him up around 3AM.  History of Present Illness:   Carrie Butler is 60 year old female with past medical history of tobacco abuse and hypertension who recently admitted with inferior STEMI.  Symptoms wax and wane for 18 hours prior to admission.  Unfortunately when she arrived, she was quite skeptical of medical therapy and prefers cannabis based therapy and natural pathic therapy.  Cardiac catheterization reveals total occlusion of mid RCA, the vessel was very tortuous, 70% ostial left circumflex artery, 30% left main, EF 40%, LVEDP 14 mmHg.  RCA was treated with 3 overlapping Onyx frontier drug-eluting stents.  She is considered high risk for recurrent acute ischemic event due to combination of medical therapy refusal.  Postprocedure, she was placed on aspirin and Brilinta with strong recommendation to stop smoking.  Carotid Doppler also revealed 80 to 99% left ICA stenosis, 1 to 39% right ICA stenosis.  She was discharged with outpatient vascular surgery referral on 06/05/2021.  Once she got home, she did do some marijuana but no other illicit drug use.  She also quit smoking tobacco as well.  She has been compliant with aspirin and Brilinta.   She was seen in her usual state of health until she woke up around 3 AM this morning complaining of chest pressure and worsening dyspnea.  High-sensitivity troponin was 4438.  Creatinine 1.98.  Potassium 3.0.  Chest x-ray shows no acute process.  EKG demonstrated minimal ST downsloping in the  lateral leads specifically V5 and V6, minimal J-point elevation in lead II and III which does not appears to be significant.  Looking in the patient, she appears to be uncomfortable and laying in bed.  Heart rate is 110 despite resting position.   Past Medical History:  Diagnosis Date   Coronary artery disease    Hyperlipidemia    Hypertension     Past Surgical History:  Procedure Laterality Date   CORONARY/GRAFT ACUTE MI REVASCULARIZATION N/A 06/03/2021   Procedure: Coronary/Graft Acute MI Revascularization;  Surgeon: Belva Crome, MD;  Location: [place] [facility];  Service: Cardiovascular;  Laterality: N/A;   LEFT HEART CATH AND CORONARY ANGIOGRAPHY N/A 06/03/2021   Procedure: LEFT HEART CATH AND CORONARY ANGIOGRAPHY;  Surgeon: Belva Crome, MD;  Location: [place] [facility];  Service: Cardiovascular;  Laterality: N/A;     Medications Prior to Admission: Prior to Admission medications   Medication Sig Start Date End Date Taking? Authorizing Provider  aspirin 81 MG chewable tablet Chew 1 tablet (81 mg total) by mouth daily. 06/06/21  Yes Almyra Deforest, PA  atorvastatin (LIPITOR) 80 MG tablet Take 1 tablet (80 mg total) by mouth daily. 06/05/21  Yes Almyra Deforest, PA  hydrALAZINE (APRESOLINE) 25 MG tablet Take 1 tablet (25 mg total) by mouth every 8 (eight) hours. 06/05/21  Yes Almyra Deforest, PA  losartan (COZAAR) 50 MG tablet Take 1 tablet (50 mg total) by mouth daily. 06/06/21  Yes Almyra Deforest, PA  metoprolol succinate (TOPROL-XL) 50 MG 24 hr tablet Take 1 tablet (50 mg  total) by mouth daily. Take with or immediately following a meal. 06/06/21  Yes Meng, [place], PA  nitroGLYCERIN (NITROSTAT) 0.4 MG SL tablet Place 1 tablet (0.4 mg total) under the tongue every 5 (five) minutes as needed for chest pain. 06/05/21  Yes Almyra Deforest, PA  ticagrelor (BRILINTA) 90 MG TABS tablet Take 1 tablet (90 mg total) by mouth 2 (two) times daily. 06/05/21  Yes Almyra Deforest, PA     Allergies:   No Known  Allergies  Social History:   Social History   Socioeconomic History   Marital status: Soil scientist    Spouse name: Not on file   Number of children: Not on file   Years of education: Not on file   Highest education level: Not on file  Occupational History   Not on file  Tobacco Use   Smoking status: Every Day    Packs/day: 1.00    Years: 15.00    Pack years: 15.00    Types: Cigarettes   Smokeless tobacco: Never  Substance and Sexual Activity   Alcohol use: Never   Drug use: Yes    Types: Marijuana   Sexual activity: Yes  Other Topics Concern   Not on file  Social History Narrative   Not on file   Social Determinants of Health   Financial Resource Strain: Not on file  Food Insecurity: Not on file  Transportation Needs: Not on file  Physical Activity: Not on file  Stress: Not on file  Social Connections: Not on file  Intimate Partner Violence: Not on file    Family History:   The patient's family history includes Heart disease in her mother.    ROS:  Please see the history of present illness.  All other ROS reviewed and negative.     Physical Exam/Data:   Vitals:   06/07/21 0603 06/07/21 0630 06/07/21 0721  BP:  112/79 127/86  Pulse:  (!) 101 90  Resp:  18 17  Temp:  98.4 F (36.9 C)   TempSrc:  Oral   SpO2:  97% 98%  Weight: 33.2 kg    Height: 4\' 10"  (1.473 m)     No intake or output data in the 24 hours ending 06/07/21 0841 Last 3 Weights 06/07/2021 06/04/2021 06/03/2021  Weight (lbs) 73 lb 3.1 oz 73 lb 1.6 oz 78 lb 15.9 oz  Weight (kg) 33.2 kg 33.158 kg 35.83 kg     Body mass index is 15.3 kg/m.  General:  appears to be uncomfortable HEENT: normal Neck: no JVD Vascular: No carotid bruits; Distal pulses 2+ bilaterally   Cardiac:  normal S1, S2; RRR; no murmur  Lungs:  clear to auscultation bilaterally, no wheezing, rhonchi or rales  Abd: soft, nontender, no hepatomegaly  Ext: no edema Musculoskeletal:  No deformities, BUE and BLE  strength normal and equal Skin: warm and dry  Neuro:  CNs 2-12 intact, no focal abnormalities noted Psych:  Normal affect    EKG:  The ECG that was done and was personally reviewed and demonstrates sinus rhythm with downsloping ST segment in lead V5 and V6, minimal J-point elevation in the inferior lead with Q waves in the inferior lead.  Relevant CV Studies:  Cath 06/03/2021   A stent was successfully placed.   Post intervention, there is a 50% residual stenosis.   Post intervention, there is a 70% residual stenosis.   Acute inferior ST elevation MI due to total mid vessel occlusion Successful complicated procedure requiring guide catheter  extension, double wire, and overlapping stenting from distal vessel to the ostium of RCA.  3 stents used.  Residual stenosis of approximately 30% in the mid vessel due to prolapse of tissue at site of stent overlap.  The ostial RCA stent overhangs into the aorta significantly.  Not likely to be able to selectively engage in the future.  Beyond the stented segment there is a angulated 50 to 70% stenosis. Ostial 70 to 80% circumflex.  First obtuse marginal 70% mid.  Mid circumflex 50%. Distal left main 20 to 30%. Ostial LAD 20 to 30%.  30% mid. Moderate inferobasal hypokinesis with EF 40%.   RECOMMENDATIONS:   Aggressive risk factor modification including smoking cessation, and encouraging the patient to use medications for control of blood pressure and cholesterol. High risk for recurrent acute ischemic events due to phenotype and conventional medical therapy refusal  Laboratory Data:  High Sensitivity Troponin:   Recent Labs  Lab 06/03/21 1144 06/07/21 0645  TROPONINIHS 163* 4,438*      Chemistry Recent Labs  Lab 06/04/21 0259 06/07/21 0645  NA 133* 135  K 3.5 3.0*  CL 101 102  CO2 23 19*  GLUCOSE 129* 116*  BUN 16 24*  CREATININE 1.10* 1.18*  CALCIUM 8.7* 9.3  GFRNONAA 58* 54*  ANIONGAP 9 14    Recent Labs  Lab 06/03/21 1144  06/07/21 0645  PROT 6.1* 7.0  ALBUMIN 3.2* 3.7  AST 27 69*  ALT 17 38  ALKPHOS 59 66  BILITOT 0.7 0.7   Lipids  Recent Labs  Lab 06/03/21 1623  CHOL 247*  TRIG 49  HDL 79  LDLCALC 158*  CHOLHDL 3.1   Hematology Recent Labs  Lab 06/04/21 0259 06/07/21 0645  WBC 12.7* 17.1*  RBC 4.05 4.86  HGB 11.8* 14.4  HCT 35.1* 43.1  MCV 86.7 88.7  MCH 29.1 29.6  MCHC 33.6 33.4  RDW 14.2 14.0  PLT 344 278   Thyroid No results for input(s): TSH, FREET4 in the last 168 hours. BNPNo results for input(s): BNP, PROBNP in the last 168 hours.  DDimer No results for input(s): DDIMER in the last 168 hours.   Radiology/Studies:  DG CHEST PORT 1 VIEW  Result Date: 06/07/2021 CLINICAL DATA:  Chest pressure EXAM: PORTABLE CHEST 1 VIEW COMPARISON:  06/03/2021 FINDINGS: Lungs are clear.  No pleural effusion or pneumothorax. The heart is normal in size. IMPRESSION: No evidence of acute cardiopulmonary disease. Electronically Signed   By: Julian Hy M.D.   On: 06/07/2021 07:22     Assessment and Plan:   Chest pressure with elevated troponin  -Symptoms started around 3 AM and improved slightly however has not completely went away.  She has been compliant with aspirin and Brilinta.  She did do marijuana 2 days ago after she got home.  -No significant ST elevation on the EKG, however patient is clearly uncomfortable with a heart rate of 110 in sinus rhythm.  Given persistent symptoms, discussed with MD, plan for urgent cardiac catheterization today.  Dr. Sallyanne Kuster has discussed the case with STEMI doc Dr. Martinique.  - Risk and benefit of procedure explained to the patient who display clear understanding and agree to proceed.  Discussed with patient possible procedural risk include bleeding, vascular injury, renal injury, arrythmia, MI, stroke and loss of limb or life.  CAD: On aspirin and Brilinta.  Recently underwent DES x3 to the RCA  Hyperlipidemia  Hypertension  Tobacco abuse:  Patient says she has not smoked cigarette since  her heart attack  Hypokalemia: Given 40 meq of potassium p.o. in the emergency room.  Significant left carotid artery disease: Seen on recent carotid ultrasound, patient was referred to vascular surgery as outpatient to further evaluate.   Risk Assessment/Risk Scores:    TIMI Risk Score for Unstable Angina or Non-ST Elevation MI:   The patient's TIMI risk score is 4, which indicates a 20% risk of all cause mortality, new or recurrent myocardial infarction or need for urgent revascularization in the next 14 days.       Severity of Illness: The appropriate patient status for this patient is OBSERVATION. Observation status is judged to be reasonable and necessary in order to provide the required intensity of service to ensure the patient's safety. The patient's presenting symptoms, physical exam findings, and initial radiographic and laboratory data in the context of their medical condition is felt to place them at decreased risk for further clinical deterioration. Furthermore, it is anticipated that the patient will be medically stable for discharge from the hospital within 2 midnights of admission.    For questions or updates, please contact [place] Please consult [email or web address] for contact info under     Signed, Almyra Deforest, [place]  06/07/2021 8:41 AM    I have seen and examined the patient along with Almyra Deforest, PA.  I have reviewed the chart, notes and new data.  I agree with PA/NP's note.  Key new complaints: Continues to have chest pressure in the retrosternal area and complains primarily of inability to catch her breath. Key examination changes: Clearly uncomfortable, although not in acute distress.  She is cachectic.  Normal heart sounds and no murmurs, regular rate and rhythm.  She has a small ecchymosis at the right radial cath access site, but no hematoma and has an excellent pulse. Key new findings / data: ECG shows dynamic  changes.  On arrival she had widely spread ST segment depression seen both in the inferior leads as well as across the mid and lateral precordial leads.  This has improved while in the emergency room.  No longer has inferior ST segment depression.  High-sensitivity troponin is greater than 4000, cannot be sure that this is not residual from her initial presentation with STEMI roughly 72 hours ago (troponin not rechecked after PCI).  Angiographic images reviewed.  No evidence of overt CHF on chest x-ray.  Needs an echocardiogram.  PLAN: Findings are consistent with recurrent acute coronary syndrome, unclear based on ECG whether this is related to a different coronary distribution or there is an issue with the RCA revascularization.  Recommend repeat coronary angiography. This procedure has been fully reviewed with the patient and written informed consent has been obtained.   Sanda Klein, MD, [place] [phone] 06/07/2021, 9:03 AM

## 2021-06-07 NOTE — ED Triage Notes (Signed)
Pt brought in by EMS for chest pressure that started around 0300. Pt had an MI 4 days ago and had 3 stents placed.

## 2021-06-07 NOTE — Progress Notes (Signed)
ANTICOAGULATION CONSULT NOTE - Initial Consult  Pharmacy Consult for heparin Indication: chest pain/ACS  No Known Allergies  Patient Measurements: Height: 4\' 10"  (147.3 cm) Weight: 33.2 kg (73 lb 3.1 oz) IBW/kg (Calculated) : 40.9 Heparin Dosing Weight: 33.2 kg   Vital Signs: Temp: 98.4 F (36.9 C) (12/26 0630) Temp Source: Oral (12/26 0630) BP: 127/86 (12/26 0721) Pulse Rate: 90 (12/26 0721)  Labs: Recent Labs    06/07/21 0645  HGB 14.4  HCT 43.1  PLT 278  CREATININE 1.18*  TROPONINIHS 4,438*    Estimated Creatinine Clearance: 27.2 mL/min (A) (by C-G formula based on SCr of 1.18 mg/dL (H)).   Medical History: Past Medical History:  Diagnosis Date   Coronary artery disease    Hyperlipidemia    Hypertension     Medications:  Scheduled:   heparin  2,000 Units Intravenous Once   potassium chloride SA  40 mEq Oral Once    Assessment: 54 yof presenting with CP -recent stent placed on 12/22 to RCA. No AC PTA.   Hgb 14.4, plt 278. Trop [id number]. No s/sx of bleeding.  Goal of Therapy:  Heparin level 0.3-0.7 units/ml Monitor platelets by anticoagulation protocol: Yes   Plan:  Give 2000 units bolus x 1 Start heparin infusion at 400 units/hr Check anti-Xa level in 6 hours and daily while on heparin Continue to monitor H&H and platelets  Antonietta Jewel, PharmD, [place] Pharmacist  Phone: [phone] 06/07/2021 8:48 AM  Please check AMION for all [place] phone numbers After 10:00 PM, call [place] [phone]

## 2021-06-07 NOTE — ED Notes (Signed)
Cards at bedside

## 2021-06-07 NOTE — ED Provider Notes (Signed)
[place] EMERGENCY DEPARTMENT Provider Note   CSN: [id number] Arrival date & time: 06/07/21  0600     History Chief Complaint  Patient presents with   Chest Pain    Carrie Butler is a 60 y.o. female with a past medical history of coronary artery disease, acute ST elevation myocardial infarction 4 days ago status post heart catheterization x3 of the RCA with significant residual heart disease.  Patient states that she was doing well other than some generalized weakness after discharge when this morning she was awoken from sleep with significant shortness of breath.  Approximately 10 minutes later she began having severe pressure on her chest rated at an intensity of 6 out of 10.  She took nitroglycerin without relief.  She then called EMS and took 325 of aspirin.  States this is exactly what her MI felt like 2 days ago.  She had associated nausea which is now resolved.  She currently rates her discomfort at 3 out of 10.  She denies any chest pain or hemoptysis.   The history is provided by the patient and medical records. No language interpreter was used.  Chest Pain Pain location:  Substernal area Pain quality: pressure   Pain radiates to:  Does not radiate Pain severity:  No pain Onset quality:  Sudden Timing:  Constant Progression:  Improving Chronicity:  Recurrent Context: at rest   Relieved by:  Nothing Ineffective treatments:  Nitroglycerin and aspirin Associated symptoms: nausea and shortness of breath   Risk factors: coronary artery disease, high cholesterol, hypertension and smoking       Past Medical History:  Diagnosis Date   Coronary artery disease    Hyperlipidemia    Hypertension     Patient Active Problem List   Diagnosis Date Noted   Carotid artery disease ([place]) 06/05/2021   Acute ST elevation myocardial infarction (STEMI) of inferior wall ([place]) 06/03/2021   Tobacco use 06/03/2021   Primary hypertension 06/03/2021    Hyperlipidemia LDL goal <70 06/03/2021    Past Surgical History:  Procedure Laterality Date   CORONARY/GRAFT ACUTE MI REVASCULARIZATION N/A 06/03/2021   Procedure: Coronary/Graft Acute MI Revascularization;  Surgeon: Belva Crome, MD;  Location: [facility] [facility];  Service: Cardiovascular;  Laterality: N/A;   LEFT HEART CATH AND CORONARY ANGIOGRAPHY N/A 06/03/2021   Procedure: LEFT HEART CATH AND CORONARY ANGIOGRAPHY;  Surgeon: Belva Crome, MD;  Location: [place] [facility];  Service: Cardiovascular;  Laterality: N/A;     OB History   No obstetric history on file.     Family History  Problem Relation Age of Onset   Heart disease Mother     Social History   Tobacco Use   Smoking status: Every Day    Packs/day: 1.00    Years: 15.00    Pack years: 15.00    Types: Cigarettes   Smokeless tobacco: Never  Substance Use Topics   Alcohol use: Never   Drug use: Yes    Types: Marijuana    Home Medications Prior to Admission medications   Medication Sig Start Date End Date Taking? Authorizing Provider  aspirin 81 MG chewable tablet Chew 1 tablet (81 mg total) by mouth daily. 06/06/21   Almyra Deforest, PA  atorvastatin (LIPITOR) 80 MG tablet Take 1 tablet (80 mg total) by mouth daily. 06/05/21   Almyra Deforest, PA  hydrALAZINE (APRESOLINE) 25 MG tablet Take 1 tablet (25 mg total) by mouth every 8 (eight) hours. 06/05/21  Almyra Deforest, PA  losartan (COZAAR) 50 MG tablet Take 1 tablet (50 mg total) by mouth daily. 06/06/21   Almyra Deforest, PA  metoprolol succinate (TOPROL-XL) 50 MG 24 hr tablet Take 1 tablet (50 mg total) by mouth daily. Take with or immediately following a meal. 06/06/21   Almyra Deforest, PA  nitroGLYCERIN (NITROSTAT) 0.4 MG SL tablet Place 1 tablet (0.4 mg total) under the tongue every 5 (five) minutes as needed for chest pain. 06/05/21   Almyra Deforest, PA  ticagrelor (BRILINTA) 90 MG TABS tablet Take 1 tablet (90 mg total) by mouth 2 (two) times daily. 06/05/21   Almyra Deforest, [place]     Allergies    Patient has no known allergies.  Review of Systems   Review of Systems  Respiratory:  Positive for shortness of breath.   Cardiovascular:  Positive for chest pain.  Gastrointestinal:  Positive for nausea.  Ten systems reviewed and are negative for acute change, except as noted in the HPI.   Physical Exam Updated Vital Signs BP 112/79    Pulse (!) 101    Temp 98.4 F (36.9 C) (Oral)    Resp 18    Ht 4\' 10"  (1.473 m)    Wt 33.2 kg    SpO2 97%    BMI 15.30 kg/m   Physical Exam Vitals and nursing note reviewed.  Constitutional:      General: She is not in acute distress.    Appearance: She is well-developed. She is not diaphoretic.  HENT:     Head: Normocephalic and atraumatic.     Right Ear: External ear normal.     Left Ear: External ear normal.     Nose: Nose normal.     Mouth/Throat:     Mouth: Mucous membranes are moist.  Eyes:     General: No scleral icterus.    Conjunctiva/sclera: Conjunctivae normal.  Cardiovascular:     Rate and Rhythm: Normal rate and regular rhythm.     Heart sounds: Normal heart sounds. No murmur heard.   No friction rub. No gallop.  Pulmonary:     Effort: Pulmonary effort is normal. Tachypnea present. No respiratory distress.     Breath sounds: Normal breath sounds.  Abdominal:     General: Bowel sounds are normal. There is no distension.     Palpations: Abdomen is soft. There is no mass.     Tenderness: There is no abdominal tenderness. There is no guarding.  Musculoskeletal:     Cervical back: Normal range of motion.  Skin:    General: Skin is warm and dry.  Neurological:     Mental Status: She is alert and oriented to person, place, and time.  Psychiatric:        Behavior: Behavior normal.    ED Results / Procedures / Treatments   Labs (all labs ordered are listed, but only abnormal results are displayed) Labs Reviewed  RESP PANEL BY RT-PCR (FLU A&B, COVID) [id number]  COMPREHENSIVE METABOLIC PANEL  CBC WITH  DIFFERENTIAL/PLATELET  TROPONIN I (HIGH SENSITIVITY)    EKG EKG Interpretation  Date/Time:  Monday June 07 2021 06:28:34 EST Ventricular Rate:  98 PR Interval:  124 QRS Duration: 86 QT Interval:  340 QTC Calculation: 434 R Axis:   87 Text Interpretation: Normal sinus rhythm Biatrial enlargement Left ventricular hypertrophy with repolarization abnormality ( Sokolow-Lyon , Romhilt-Estes ) Anterior infarct , age undetermined Abnormal ECG New since previous tracing Confirmed by Orpah Greek [phone]) on 06/07/2021 6:38:45  AM  Radiology VAS US CAROTID  Result Date: 06/06/2021 Carotid Arterial Duplex Study Patient Name:  Carrie Butler [facility]  Date of Exam:   06/05/2021 Medical Rec #: [id number]          Accession #:    [id number] Date of Birth: 10-Jan-1961           Patient Gender: F Patient Age:   63 years Exam Location:  [facility] Procedure:      VAS US CAROTID Referring Phys: Ria Comment ROBERTS --------------------------------------------------------------------------------  Indications:       Left bruit. Risk Factors:      Hypertension, hyperlipidemia, current smoker. Comparison Study:  No prior studies. Performing Technologist: Oliver Hum RVT  Examination Guidelines: A complete evaluation includes B-mode imaging, spectral Doppler, color Doppler, and power Doppler as needed of all accessible portions of each vessel. Bilateral testing is considered an integral part of a complete examination. Limited examinations for reoccurring indications may be performed as noted.  Right Carotid Findings: +----------+--------+--------+--------+-----------------------+--------+             PSV cm/s EDV cm/s Stenosis Plaque Description      Comments  +----------+--------+--------+--------+-----------------------+--------+  CCA Prox   101      19                smooth and heterogenous           +----------+--------+--------+--------+-----------------------+--------+  CCA Distal 81       25                 smooth and heterogenous           +----------+--------+--------+--------+-----------------------+--------+  ICA Prox   138      32                calcific                          +----------+--------+--------+--------+-----------------------+--------+  ICA Distal 66       23                                                  +----------+--------+--------+--------+-----------------------+--------+  ECA        163      20                                                  +----------+--------+--------+--------+-----------------------+--------+ +----------+--------+-------+--------+-------------------+             PSV cm/s EDV cms Describe Arm Pressure (mmHG)  +----------+--------+-------+--------+-------------------+  Subclavian 143                                            +----------+--------+-------+--------+-------------------+ +---------+--------+--+--------+--+---------+  Vertebral PSV cm/s 26 EDV cm/s 11 Antegrade  +---------+--------+--+--------+--+---------+  Left Carotid Findings: +----------+--------+--------+--------+-----------------------+--------+             PSV cm/s EDV cm/s Stenosis Plaque Description      Comments  +----------+--------+--------+--------+-----------------------+--------+  CCA Prox   72       19  smooth and heterogenous           +----------+--------+--------+--------+-----------------------+--------+  CCA Distal 67       31                smooth and heterogenous           +----------+--------+--------+--------+-----------------------+--------+  ICA Prox   139      66                calcific                          +----------+--------+--------+--------+-----------------------+--------+  ICA Mid    257      124               calcific                          +----------+--------+--------+--------+-----------------------+--------+  ICA Distal 74       31                                                   +----------+--------+--------+--------+-----------------------+--------+  ECA        325      45                                                  +----------+--------+--------+--------+-----------------------+--------+ +----------+--------+--------+--------+-------------------+             PSV cm/s EDV cm/s Describe Arm Pressure (mmHG)  +----------+--------+--------+--------+-------------------+  Subclavian 168                                             +----------+--------+--------+--------+-------------------+ +---------+--------+--+--------+--+---------+  Vertebral PSV cm/s 17 EDV cm/s 10 Antegrade  +---------+--------+--+--------+--+---------+   Summary: Right Carotid: Velocities in the right ICA are consistent with a 1-39% stenosis. Left Carotid: Velocities in the left ICA are consistent with a 80-99% stenosis. Vertebrals: Bilateral vertebral arteries demonstrate antegrade flow. *See table(s) above for measurements and observations.  Electronically signed by Jamelle Haring on 06/06/2021 at 3:28:40 PM.    Final     Procedures .Critical Care Performed by: Margarita Mail, PA-C Authorized by: Margarita Mail, PA-C   Critical care provider statement:    Critical care time (minutes):  70   Critical care time was exclusive of:  Separately billable procedures and treating other patients   Critical care was necessary to treat or prevent imminent or life-threatening deterioration of the following conditions:  Cardiac failure   Critical care was time spent personally by me on the following activities:  Discussions with consultants, evaluation of patient's response to treatment, examination of patient, interpretation of cardiac output measurements, obtaining history from patient or surrogate, review of old charts, re-evaluation of patient's condition, pulse oximetry, ordering and review of radiographic studies, ordering and review of laboratory studies and ordering and performing treatments and interventions    Medications Ordered in ED Medications - No data to display  ED Course  I have reviewed the triage vital signs and the nursing notes.  Pertinent labs & imaging  results that were available during my care of the patient were reviewed by me and considered in my medical decision making (see chart for details).  Clinical Course as of 06/09/21 4315  [facility] Jun 07, 2021  0733 Case discussed with Dr. Sallyanne Kuster of Cardiology- service will consult on the patient. [AH]  0814 Troponin I (High Sensitivity)(!!): 4,438 [AH]    Clinical Course User Index [AH] Margarita Mail, PA-C   MDM Rules/Calculators/A&P                           This is a 36 y/ female here with chest pressure similar to MI she had 4 days ago- cath report shoes fairly severe and extensive disease.  DDX includes- recurrent MI, - less likely Dresseler's syndrome, PE, Reflux, acute PTX.  I ordered and reviewed labs Cbc- leukocytosis-17.1- APRx CMP- hypokalemia to 3.0 repleted orally, mild bump in cr Resp panel is negative troponin elevated to 4438 representing marked rise from d/c trop of 167   EKG- r-nn, nsr- new ST seg depression lateral leads and down turned T waves inferiorly- appears ischemic  Patient case discussed very early in the case with Dr. Loletha Grayer -regarding concerning sxs- and EKG. Follow up SM sent when trops returned.-  I ordered heparin and ntg. Dr. Loletha Grayer then activated cath lab with a code STEMI-. Patient will be admitted by the Cardiology team.  Final Clinical Impression(s) / ED Diagnoses Final diagnoses:  Chest pain    Rx / DC Orders ED Discharge Orders     None        Margarita Mail, PA-C 06/09/21 [place], [place], DO 06/14/21 [phone]

## 2021-06-07 NOTE — Progress Notes (Signed)
*  PRELIMINARY RESULTS* Echocardiogram 2D Echocardiogram has been performed.  Carrie Butler 06/07/2021, 3:35 PM

## 2021-06-08 ENCOUNTER — Encounter (HOSPITAL_COMMUNITY): Payer: Self-pay | Admitting: Cardiology

## 2021-06-08 DIAGNOSIS — I214 Non-ST elevation (NSTEMI) myocardial infarction: Secondary | ICD-10-CM | POA: Insufficient documentation

## 2021-06-08 DIAGNOSIS — R64 Cachexia: Secondary | ICD-10-CM | POA: Diagnosis not present

## 2021-06-08 DIAGNOSIS — E785 Hyperlipidemia, unspecified: Secondary | ICD-10-CM

## 2021-06-08 DIAGNOSIS — Z720 Tobacco use: Secondary | ICD-10-CM

## 2021-06-08 DIAGNOSIS — Z955 Presence of coronary angioplasty implant and graft: Secondary | ICD-10-CM

## 2021-06-08 DIAGNOSIS — I9589 Other hypotension: Secondary | ICD-10-CM | POA: Diagnosis not present

## 2021-06-08 DIAGNOSIS — I2119 ST elevation (STEMI) myocardial infarction involving other coronary artery of inferior wall: Principal | ICD-10-CM

## 2021-06-08 DIAGNOSIS — I10 Essential (primary) hypertension: Secondary | ICD-10-CM

## 2021-06-08 DIAGNOSIS — D72829 Elevated white blood cell count, unspecified: Secondary | ICD-10-CM

## 2021-06-08 DIAGNOSIS — Z20822 Contact with and (suspected) exposure to covid-19: Secondary | ICD-10-CM | POA: Diagnosis not present

## 2021-06-08 DIAGNOSIS — I6522 Occlusion and stenosis of left carotid artery: Secondary | ICD-10-CM

## 2021-06-08 HISTORY — DX: Non-ST elevation (NSTEMI) myocardial infarction: I21.4

## 2021-06-08 LAB — BASIC METABOLIC PANEL
Anion gap: 5 (ref 5–15)
BUN: 21 mg/dL — ABNORMAL HIGH (ref 6–20)
CO2: 18 mmol/L — ABNORMAL LOW (ref 22–32)
Calcium: 8.5 mg/dL — ABNORMAL LOW (ref 8.9–10.3)
Chloride: 110 mmol/L (ref 98–111)
Creatinine, Ser: 0.94 mg/dL (ref 0.44–1.00)
GFR, Estimated: >60 mL/min (ref >60)
Glucose, Bld: 119 mg/dL — ABNORMAL HIGH (ref 70–99)
Potassium: 4 mmol/L (ref 3.5–5.1)
Sodium: 133 mmol/L — ABNORMAL LOW (ref 135–145)

## 2021-06-08 MED ORDER — ISOSORBIDE MONONITRATE ER 30 MG PO TB24
30.0000 mg | ORAL_TABLET | Freq: Every day | ORAL | Status: DC
  Administered 2021-06-09 – 2021-06-10 (×2): 30 mg via ORAL
  Filled 2021-06-08 (×2): qty 1

## 2021-06-08 MED FILL — Verapamil HCl IV Soln 2.5 MG/ML: INTRAVENOUS | Qty: 2 | Status: AC

## 2021-06-08 NOTE — Care Management (Signed)
°  Transition of Care [facility]) Screening Note   Patient Details  Name: Carrie Butler Date of Birth: 02-18-1961   Transition of Care [facility]) CM/SW Contact:    Carles Collet, RN Phone Number: 06/08/2021, 3:15 PM    Transition of Care Department [facility]) has reviewed patient and no TOC needs have been identified at this time. We will continue to monitor patient advancement through interdisciplinary progression rounds. If new patient transition needs arise, please place a TOC consult.

## 2021-06-08 NOTE — Progress Notes (Addendum)
Progress Note  Patient Name: Carrie Butler Date of Encounter: 06/09/2021  [facility] Cardiologist: None new [place], [place]  Patient Profile     60 y.o. female with PMH notable for tobacco abuse, HTN (previously on no medications preferring "holistic therapy " and recently diagnosed CAD with inferior STEMI on June 03, 2021 -> DES PCI of RCA 1 her percent occlusion (very tortuous requiring 3 overlapping stents but residual distal 70% RCA stenosis beyond 3 stents.  Also 70% ostial LCx with 30% LM favorable for PCI.  EF mildly reduced at 40%.  She is also noted to have 80-90% left ICA stenosis with plans for outpatient vascular surgery referral. ->  She was discharged on 06/05/2021, but was readmitted on 1226 with recurrent substernal chest pressure and dyspnea.  She did have mild bump in her troponin levels from the 3000s-4000s.  Based on acute presentation she was taken back to the cardiac catheterization lab on 06/07/2021 by Dr. [place] and found to have stable findings on cardiac catheterization.  No further PCI.-Unclear what the cause of recent event was.  Subjective   Feels much better today.  Had a little bit of a "rattling sensation in her chest when she coughed today but otherwise has been feeling fine.  No further chest pain or dyspnea.  Inpatient Medications    Scheduled Meds:  aspirin EC  81 mg Oral Daily   atorvastatin  80 mg Oral Daily   Chlorhexidine Gluconate Cloth  6 each Topical Daily   heparin  5,000 Units Subcutaneous Q8H   hydrALAZINE  25 mg Oral Q8H   isosorbide mononitrate  30 mg Oral Daily   losartan  50 mg Oral Daily   metoprolol succinate  75 mg Oral Daily   sodium chloride flush  3 mL Intravenous Q12H   sodium chloride flush  3 mL Intravenous Q12H   ticagrelor  90 mg Oral BID   Continuous Infusions:  sodium chloride     PRN Meds: sodium chloride, acetaminophen, nitroGLYCERIN, ondansetron (ZOFRAN) IV, sodium chloride flush    Vital Signs    Vitals:   06/08/21 1500 06/08/21 1552 06/08/21 1955 06/09/21 0021  BP: 98/73 117/75 134/81 109/60  Pulse:  62 67 60  Resp: (!) 23 18 18 17   Temp:  98.7 F (37.1 C) 97.8 F (36.6 C) 97.9 F (36.6 C)  TempSrc:  Oral Oral Oral  SpO2:  98% 99% 100%  Weight:  33.5 kg    Height:        Intake/Output Summary (Last 24 hours) at 06/09/2021 0048 Last data filed at 06/08/2021 0900 Gross per 24 hour  Intake 120 ml  Output --  Net 120 ml   Last 3 Weights 06/08/2021 06/07/2021 06/04/2021  Weight (lbs) 73 lb 14.4 oz 73 lb 3.1 oz 73 lb 1.6 oz  Weight (kg) 33.521 kg 33.2 kg 33.158 kg      Telemetry    Sinus rhythm- Personally Reviewed  ECG    No new EKG- Personally Reviewed  Physical Exam   GEN: No acute distress.  Resting comfortably in bed.  Thin, frail. Neck: No JVD, left carotid bruit Cardiac: RRR, normal S1-S2.  No murmurs, rubs, or gallops.  Respiratory: Initial bibasal rhonchi, somewhat clears with cough.  Otherwise diffuse rhonchi but no wheezes or rales. GI: Soft, nontender, non-distended  MS: No edema; No deformity. Neuro:  Nonfocal  Psych: Normal affect   Labs    High Sensitivity Troponin:   Recent  Labs  Lab 06/03/21 1144 06/07/21 0645 06/07/21 0829 06/07/21 1058 06/07/21 1407  TROPONINIHS 163* 4,438* 3,851* 3,905* 4,249*     Chemistry Recent Labs  Lab 06/03/21 1144 06/03/21 1152 06/04/21 0259 06/07/21 0645 06/08/21 0012  NA 137   < > 133* 135 133*  K 3.9   < > 3.5 3.0* 4.0  CL 108   < > 101 102 110  CO2 19*  --  23 19* 18*  GLUCOSE 145*   < > 129* 116* 119*  BUN 14   < > 16 24* 21*  CREATININE 0.96   < > 1.10* 1.18* 0.94  CALCIUM 8.8*  --  8.7* 9.3 8.5*  PROT 6.1*  --   --  7.0  --   ALBUMIN 3.2*  --   --  3.7  --   AST 27  --   --  69*  --   ALT 17  --   --  38  --   ALKPHOS 59  --   --  66  --   BILITOT 0.7  --   --  0.7  --   GFRNONAA >60  --  58* 54* >60  ANIONGAP 10  --  9 14 5    < > = values in this interval not  displayed.    Lipids  Recent Labs  Lab 06/03/21 1623  CHOL 247*  TRIG 49  HDL 79  LDLCALC 158*  CHOLHDL 3.1    Hematology Recent Labs  Lab 06/03/21 1144 06/03/21 1152 06/04/21 0259 06/07/21 0645  WBC 13.5*  --  12.7* 17.1*  RBC 4.51  --  4.05 4.86  HGB 13.1 13.9 11.8* 14.4  HCT 39.2 41.0 35.1* 43.1  MCV 86.9  --  86.7 88.7  MCH 29.0  --  29.1 29.6  MCHC 33.4  --  33.6 33.4  RDW 14.1  --  14.2 14.0  PLT 381  --  344 278   Thyroid No results for input(s): TSH, FREET4 in the last 168 hours.  BNPNo results for input(s): BNP, PROBNP in the last 168 hours.  DDimer No results for input(s): DDIMER in the last 168 hours.   Radiology    CARDIAC CATHETERIZATION  Result Date: 06/07/2021   Mid LM to Dist LM lesion is 30% stenosed.   Ost LAD to Prox LAD lesion is 40% stenosed.   Prox LAD to Mid LAD lesion is 30% stenosed.   Mid Cx lesion is 65% stenosed.   Prox RCA to Mid RCA lesion is 30% stenosed.   Mid RCA lesion is 70% stenosed.   Dist RCA lesion is 50% stenosed.   1st Mrg lesion is 70% stenosed.   Ost Cx lesion is 80% stenosed.   Non-stenotic Ost RCA lesion was previously treated.   LV end diastolic pressure is normal. Continued patency of the stents in the RCA. There is mild tissue prolapse in the mid stent but this is not flow limiting. There is no change in the 70% stenosis distal to the stent in a very tortuous segment. The patient does have moderate to severe disease at the ostium of the LCx that may be the source of her symptoms. This is not amenable to PCI given trifurcation. Low LVEDP Plan: optimize medical therapy. Increase Toprol XL dose and add oral nitrates. Check Echo.   DG CHEST PORT 1 VIEW  Result Date: 06/07/2021 CLINICAL DATA:  Chest pressure EXAM: PORTABLE CHEST 1 VIEW COMPARISON:  06/03/2021 FINDINGS: Lungs are clear.  No pleural effusion or pneumothorax. The heart is normal in size. IMPRESSION: No evidence of acute cardiopulmonary disease. Electronically Signed    By: Julian Hy M.D.   On: 06/07/2021 07:22   ECHOCARDIOGRAM COMPLETE  Result Date: 06/07/2021    ECHOCARDIOGRAM REPORT   Patient Name:   MARRIETTA THUNDER [facility] Date of Exam: 06/07/2021 Medical Rec #:  [id number]         Height:       58.0 in Accession #:    [id number]        Weight:       73.2 lb Date of Birth:  1960/07/03          BSA:          1.188 m Patient Age:    55 years          BP:           145/96 mmHg Patient Gender: F                 HR:           89 bpm. Exam Location:  Inpatient Procedure: 2D Echo, Cardiac Doppler and Color Doppler Indications:    Chest Pain  History:        Patient has no prior history of Echocardiogram examinations.                 Previous Myocardial Infarction; Risk Factors:Hypertension and                 Dyslipidemia. Tobacco use, Stents x3.  Sonographer:    Wenda Low Referring Phys: [phone] HAO MENG IMPRESSIONS  1. Left ventricular ejection fraction, by estimation, is 55 to 60%. The left ventricle has normal function. The left ventricle has no regional wall motion abnormalities. There is moderate left ventricular hypertrophy. Left ventricular diastolic parameters are consistent with Grade I diastolic dysfunction (impaired relaxation).  2. Right ventricular systolic function is normal. The right ventricular size is normal.  3. The mitral valve is normal in structure. No evidence of mitral valve regurgitation. No evidence of mitral stenosis.  4. The aortic valve is tricuspid. Aortic valve regurgitation is not visualized. Aortic valve sclerosis is present, with no evidence of aortic valve stenosis.  5. The inferior vena cava is normal in size with greater than 50% respiratory variability, suggesting right atrial pressure of 3 mmHg. Comparison(s): No prior Echocardiogram. FINDINGS  Left Ventricle: Left ventricular ejection fraction, by estimation, is 55 to 60%. The left ventricle has normal function. The left ventricle has no regional wall motion abnormalities. The left  ventricular internal cavity size was normal in size. There is  moderate left ventricular hypertrophy. Left ventricular diastolic parameters are consistent with Grade I diastolic dysfunction (impaired relaxation). Right Ventricle: The right ventricular size is normal. Right ventricular systolic function is normal. Left Atrium: Left atrial size was normal in size. Right Atrium: Right atrial size was normal in size. Pericardium: There is no evidence of pericardial effusion. Mitral Valve: The mitral valve is normal in structure. Mild mitral annular calcification. No evidence of mitral valve regurgitation. No evidence of mitral valve stenosis. MV peak gradient, 2.5 mmHg. The mean mitral valve gradient is 1.0 mmHg. Tricuspid Valve: The tricuspid valve is normal in structure. Tricuspid valve regurgitation is trivial. No evidence of tricuspid stenosis. Aortic Valve: The aortic valve is tricuspid. Aortic valve regurgitation is not visualized. Aortic valve sclerosis is present, with no evidence of aortic valve stenosis. Aortic valve mean gradient measures  3.0 mmHg. Aortic valve peak gradient measures 5.3  mmHg. Aortic valve area, by VTI measures 1.83 cm. Pulmonic Valve: The pulmonic valve was not well visualized. Pulmonic valve regurgitation is not visualized. No evidence of pulmonic stenosis. Aorta: The aortic root is normal in size and structure. Venous: The inferior vena cava is normal in size with greater than 50% respiratory variability, suggesting right atrial pressure of 3 mmHg. IAS/Shunts: No atrial level shunt detected by color flow Doppler.  LEFT VENTRICLE PLAX 2D LVIDd:         2.90 cm     Diastology LVIDs:         2.00 cm     LV e' medial:    4.56 cm/s LV PW:         1.00 cm     LV E/e' medial:  13.9 LV IVS:        1.00 cm     LV e' lateral:   6.66 cm/s LVOT diam:     1.80 cm     LV E/e' lateral: 9.5 LV SV:         38 LV SV Index:   32 LVOT Area:     2.54 cm  LV Volumes (MOD) LV vol d, MOD A2C: 20.5 ml LV vol d,  MOD A4C: 22.2 ml LV vol s, MOD A2C: 9.7 ml LV vol s, MOD A4C: 11.1 ml LV SV MOD A2C:     10.8 ml LV SV MOD A4C:     22.2 ml LV SV MOD BP:      11.4 ml LEFT ATRIUM           Index        RIGHT ATRIUM          Index LA diam:      1.80 cm 1.52 cm/m   RA Area:     5.56 cm LA Vol (A4C): 12.9 ml 10.86 ml/m  RA Volume:   7.23 ml  6.09 ml/m  AORTIC VALVE                    PULMONIC VALVE AV Area (Vmax):    2.23 cm     PV Vmax:       1.19 m/s AV Area (Vmean):   1.85 cm     PV Peak grad:  5.7 mmHg AV Area (VTI):     1.83 cm AV Vmax:           115.00 cm/s AV Vmean:          81.000 cm/s AV VTI:            0.207 m AV Peak Grad:      5.3 mmHg AV Mean Grad:      3.0 mmHg LVOT Vmax:         101.00 cm/s LVOT Vmean:        59.000 cm/s LVOT VTI:          0.149 m LVOT/AV VTI ratio: 0.72  AORTA Ao Root diam: 2.80 cm MITRAL VALVE MV Area (PHT): 2.38 cm    SHUNTS MV Area VTI:   1.67 cm    Systemic VTI:  0.15 m MV Peak grad:  2.5 mmHg    Systemic Diam: 1.80 cm MV Mean grad:  1.0 mmHg MV Vmax:       0.79 m/s MV Vmean:      43.7 cm/s MV Decel Time: 319 msec MV E velocity: 63.40 cm/s MV A velocity:  84.90 cm/s MV E/A ratio:  0.75 Kirk Ruths MD Electronically signed by Kirk Ruths MD Signature Date/Time: 06/07/2021/3:45:10 PM    Final     Cardiac Studies   Cardiac Cath-PCI 06/03/2021: Mid RCA 100% occlusion - ostial to distal mid RCA PCI with 3 overlapping DES stents (distal 2.5 x 30, mid 3.0 x 18, ostial-proximal 3.5 x 22 Onyx Frontier DES) but unable to pass stent to the distal 70% stenosis (very angulated).  Also noted ostial LCx 70 to 80% and OM1 mid 70% (recommended medical therapy).. Cardiac Cath 06/07/2021: mid-distal LM 30%-< Ost LCx ~80% (stable-not PCI target due to trifurcation lesion), mid LCx 65% with OM1 ~70%; Ost-prox LAD 40% with Prox-mid LaD 30%. Ostial-distal Mid RCA stents present with ~30% mid RCA stent stenosis (mild tissue prolapse)--stable tandem 70 and 50% stenoses in the distal RCA beyond the  stents.  Low LVEDP. => Stable findings, recommend optimize medical therapy.  Toprol dose increased and oral nitrate added.   TTE 06/07/2021: EF 55-60%.  Normal LV function with no R WMA.  GR 1 DD.  Aortic valve sclerosis but no stenosis.  Normal RVP.-Normal echo.    Assessment & Plan    Principal Problem:   NSTEMI (non-ST elevated myocardial infarction) [facility]) Active Problems:   Left carotid artery stenosis   Coronary artery disease involving native coronary artery of native heart with unstable angina pectoris (HCC)   Presence of drug coated stent in right coronary artery   ST elevation myocardial infarction (STEMI) of inferior wall, subsequent episode of care ([place])   Tobacco use   Primary hypertension   Hyperlipidemia LDL goal <70   Leukocytosis  Principal Problem:   NSTEMI (non-ST elevated myocardial infarction) ([place]) (postinfarct angina) with recent ST elevation myocardial infarction (STEMI) of inferior wall, subsequent episode of care [facility]);  Coronary artery disease involving native coronary artery of native heart with unstable angina pectoris (HCC) ->   Presence of drug coated stent in right coronary artery Readmitted 3 days later was charged with a shoulder inferior STEMI with postinfarct angina/unstable angina with bump in troponin consistent with non-STEMI however no clear etiology seen on cardiac catheterization.  Patent stents.  I suspect that there is probably some plaque shift or potential spasm.  Plan is continued aggressive risk factor modification and medical management. She feels much better today.  Remains hemodynamically stable with no further chest pain. Other than a troponin elevation the only abnormal finding is mild leukocytosis with no evidence of infectious etiology.  Normal chest x-ray, no fevers and otherwise feeling well.  Will monitor.   Plan: Continue with current dose of beta-blocker although we do have room to potentially move up to 100 of Toprol for  discharge, and increasing Imdur to 30 mg starting tomorrow.  She is on hydralazine and losartan.  Would like to try to consolidate but potentially consider that in the outpatient setting.  Continue high-dose statin and DAPT.  Likely need to reschedule vascular surgery consultation in the outpatient setting for carotid stenosis.  Smoking cessation counseling-she says she threw away her cigarettes when she went home, but did smoke marijuana.  Reiterated the importance of her actually taking her medications.  She promises that she has taken them.  Overall stable now hemodynamic stable.  Transfer to telemetry.  If stable tomorrow may be technically consider discharge.  Anticipate titrating beta-blocker up to 100 mg on discharge    Glenetta Hew, MD  For questions or updates, please contact [place] Please consult [email or web address]  for contact info under        Signed, Glenetta Hew, MD  Date of Service:  06/08/2021  1:04 PM

## 2021-06-09 ENCOUNTER — Encounter (HOSPITAL_COMMUNITY): Payer: Self-pay | Admitting: Cardiology

## 2021-06-09 ENCOUNTER — Other Ambulatory Visit (HOSPITAL_COMMUNITY): Payer: Self-pay

## 2021-06-09 ENCOUNTER — Inpatient Hospital Stay (HOSPITAL_COMMUNITY): Payer: BLUE CROSS/BLUE SHIELD

## 2021-06-09 DIAGNOSIS — R64 Cachexia: Secondary | ICD-10-CM | POA: Diagnosis not present

## 2021-06-09 DIAGNOSIS — E785 Hyperlipidemia, unspecified: Secondary | ICD-10-CM | POA: Diagnosis not present

## 2021-06-09 DIAGNOSIS — D72829 Elevated white blood cell count, unspecified: Secondary | ICD-10-CM | POA: Insufficient documentation

## 2021-06-09 DIAGNOSIS — Z20822 Contact with and (suspected) exposure to covid-19: Secondary | ICD-10-CM | POA: Diagnosis not present

## 2021-06-09 DIAGNOSIS — I6522 Occlusion and stenosis of left carotid artery: Secondary | ICD-10-CM | POA: Diagnosis not present

## 2021-06-09 DIAGNOSIS — I9589 Other hypotension: Secondary | ICD-10-CM | POA: Diagnosis not present

## 2021-06-09 DIAGNOSIS — I25110 Atherosclerotic heart disease of native coronary artery with unstable angina pectoris: Secondary | ICD-10-CM

## 2021-06-09 DIAGNOSIS — I2119 ST elevation (STEMI) myocardial infarction involving other coronary artery of inferior wall: Secondary | ICD-10-CM | POA: Diagnosis not present

## 2021-06-09 DIAGNOSIS — Z955 Presence of coronary angioplasty implant and graft: Secondary | ICD-10-CM

## 2021-06-09 DIAGNOSIS — I214 Non-ST elevation (NSTEMI) myocardial infarction: Secondary | ICD-10-CM | POA: Diagnosis not present

## 2021-06-09 HISTORY — DX: Presence of coronary angioplasty implant and graft: Z95.5

## 2021-06-09 HISTORY — DX: Atherosclerotic heart disease of native coronary artery with unstable angina pectoris: I25.110

## 2021-06-09 HISTORY — DX: Elevated white blood cell count, unspecified: D72.829

## 2021-06-09 LAB — RESPIRATORY PANEL BY PCR

## 2021-06-09 LAB — CBC
HCT: 34.8 % — ABNORMAL LOW (ref 36.0–46.0)
Hemoglobin: 11.8 g/dL — ABNORMAL LOW (ref 12.0–15.0)
MCH: 29.7 pg (ref 26.0–34.0)
MCHC: 33.9 g/dL (ref 30.0–36.0)
MCV: 87.7 fL (ref 80.0–100.0)
Platelets: 390 10*3/uL (ref 150–400)
RBC: 3.97 MIL/uL (ref 3.87–5.11)
RDW: 14 % (ref 11.5–15.5)
WBC: 8.7 10*3/uL (ref 4.0–10.5)
nRBC: 0 % (ref 0.0–0.2)

## 2021-06-09 LAB — BASIC METABOLIC PANEL
Anion gap: 10 (ref 5–15)
BUN: 16 mg/dL (ref 6–20)
CO2: 19 mmol/L — ABNORMAL LOW (ref 22–32)
Calcium: 8.9 mg/dL (ref 8.9–10.3)
Chloride: 105 mmol/L (ref 98–111)
Creatinine, Ser: 0.86 mg/dL (ref 0.44–1.00)
GFR, Estimated: >60 mL/min (ref >60)
Glucose, Bld: 127 mg/dL — ABNORMAL HIGH (ref 70–99)
Potassium: 3.3 mmol/L — ABNORMAL LOW (ref 3.5–5.1)
Sodium: 134 mmol/L — ABNORMAL LOW (ref 135–145)

## 2021-06-09 MED ORDER — ISOSORBIDE MONONITRATE ER 30 MG PO TB24
30.0000 mg | ORAL_TABLET | Freq: Every day | ORAL | 11 refills | Status: DC
  Filled 2021-06-09: qty 30, 30d supply, fill #0

## 2021-06-09 MED ORDER — METOPROLOL SUCCINATE ER 100 MG PO TB24: 100.0000 mg | ORAL_TABLET | Freq: Every day | ORAL | 3 refills | Status: DC

## 2021-06-09 MED ORDER — METOPROLOL SUCCINATE ER 100 MG PO TB24
100.0000 mg | ORAL_TABLET | Freq: Every day | ORAL | Status: DC
  Administered 2021-06-09 – 2021-06-10 (×2): 100 mg via ORAL
  Filled 2021-06-09 (×2): qty 1

## 2021-06-09 MED ORDER — WHITE PETROLATUM EX OINT
TOPICAL_OINTMENT | CUTANEOUS | Status: AC
  Filled 2021-06-09: qty 28.35

## 2021-06-09 NOTE — Progress Notes (Addendum)
Progress Note  Patient Name: Carrie Butler Date of Encounter: 06/09/2021  [facility] Cardiologist: Dr. Tamala Julian  Subjective   Possible discharge today. Patient reports SOB when she started eating, possibly anxiety contributing to symptoms. No chest pain. Right groin cath site stable.   Inpatient Medications    Scheduled Meds:  aspirin EC  81 mg Oral Daily   atorvastatin  80 mg Oral Daily   Chlorhexidine Gluconate Cloth  6 each Topical Daily   heparin  5,000 Units Subcutaneous Q8H   hydrALAZINE  25 mg Oral Q8H   isosorbide mononitrate  30 mg Oral Daily   losartan  50 mg Oral Daily   metoprolol succinate  75 mg Oral Daily   sodium chloride flush  3 mL Intravenous Q12H   sodium chloride flush  3 mL Intravenous Q12H   ticagrelor  90 mg Oral BID   white petrolatum       Continuous Infusions:  sodium chloride     PRN Meds: sodium chloride, acetaminophen, nitroGLYCERIN, ondansetron (ZOFRAN) IV, sodium chloride flush   Vital Signs    Vitals:   06/08/21 1955 06/09/21 0021 06/09/21 0510 06/09/21 0731  BP: 134/81 109/60 134/78 (!) 145/79  Pulse: 67 60 67 67  Resp: 18 17  19   Temp: 97.8 F (36.6 C) 97.9 F (36.6 C) 98 F (36.7 C) 97.8 F (36.6 C)  TempSrc: Oral Oral Oral Oral  SpO2: 99% 100% 98% 96%  Weight:      Height:        Intake/Output Summary (Last 24 hours) at 06/09/2021 0754 Last data filed at 06/08/2021 2126 Gross per 24 hour  Intake 360 ml  Output --  Net 360 ml   Last 3 Weights 06/08/2021 06/07/2021 06/04/2021  Weight (lbs) 73 lb 14.4 oz 73 lb 3.1 oz 73 lb 1.6 oz  Weight (kg) 33.521 kg 33.2 kg 33.158 kg      Telemetry    NSR, HR 60s - Personally Reviewed  ECG    No new - Personally Reviewed  Physical Exam   GEN: No acute distress.   Neck: No JVD Cardiac: RRR, normal S1, S2.  No murmurs, rubs, or gallops.  Respiratory: Nonlabored.  Right basal rhonchi and expiratory wheeze.  Clears somewhat with cough. GI: Soft, nontender,  non-distended  MS: No edema; No deformity. Neuro:  Nonfocal  Psych: Normal affect   Labs    High Sensitivity Troponin:   Recent Labs  Lab 06/03/21 1144 06/07/21 0645 06/07/21 0829 06/07/21 1058 06/07/21 1407  TROPONINIHS 163* 4,438* 3,851* 3,905* 4,249*     Chemistry Recent Labs  Lab 06/03/21 1144 06/03/21 1152 06/04/21 0259 06/07/21 0645 06/08/21 0012  NA 137   < > 133* 135 133*  K 3.9   < > 3.5 3.0* 4.0  CL 108   < > 101 102 110  CO2 19*  --  23 19* 18*  GLUCOSE 145*   < > 129* 116* 119*  BUN 14   < > 16 24* 21*  CREATININE 0.96   < > 1.10* 1.18* 0.94  CALCIUM 8.8*  --  8.7* 9.3 8.5*  PROT 6.1*  --   --  7.0  --   ALBUMIN 3.2*  --   --  3.7  --   AST 27  --   --  69*  --   ALT 17  --   --  38  --   ALKPHOS 59  --   --  66  --  BILITOT 0.7  --   --  0.7  --   GFRNONAA >60  --  58* 54* >60  ANIONGAP 10  --  9 14 5    < > = values in this interval not displayed.    Lipids  Recent Labs  Lab 06/03/21 1623  CHOL 247*  TRIG 49  HDL 79  LDLCALC 158*  CHOLHDL 3.1    Hematology Recent Labs  Lab 06/03/21 1144 06/03/21 1152 06/04/21 0259 06/07/21 0645  WBC 13.5*  --  12.7* 17.1*  RBC 4.51  --  4.05 4.86  HGB 13.1 13.9 11.8* 14.4  HCT 39.2 41.0 35.1* 43.1  MCV 86.9  --  86.7 88.7  MCH 29.0  --  29.1 29.6  MCHC 33.4  --  33.6 33.4  RDW 14.1  --  14.2 14.0  PLT 381  --  344 278   Thyroid No results for input(s): TSH, FREET4 in the last 168 hours.  BNPNo results for input(s): BNP, PROBNP in the last 168 hours.  DDimer No results for input(s): DDIMER in the last 168 hours.   Radiology    CARDIAC CATHETERIZATION  Result Date: 06/07/2021   Mid LM to Dist LM lesion is 30% stenosed.   Ost LAD to Prox LAD lesion is 40% stenosed.   Prox LAD to Mid LAD lesion is 30% stenosed.   Mid Cx lesion is 65% stenosed.   Prox RCA to Mid RCA lesion is 30% stenosed.   Mid RCA lesion is 70% stenosed.   Dist RCA lesion is 50% stenosed.   1st Mrg lesion is 70% stenosed.   Ost  Cx lesion is 80% stenosed.   Non-stenotic Ost RCA lesion was previously treated.   LV end diastolic pressure is normal. Continued patency of the stents in the RCA. There is mild tissue prolapse in the mid stent but this is not flow limiting. There is no change in the 70% stenosis distal to the stent in a very tortuous segment. The patient does have moderate to severe disease at the ostium of the LCx that may be the source of her symptoms. This is not amenable to PCI given trifurcation. Low LVEDP Plan: optimize medical therapy. Increase Toprol XL dose and add oral nitrates. Check Echo.   ECHOCARDIOGRAM COMPLETE  Result Date: 06/07/2021    ECHOCARDIOGRAM REPORT   Patient Name:   [name] [facility] Date of Exam: 06/07/2021 Medical Rec #:  [id number]         Height:       58.0 in Accession #:    [id number]        Weight:       73.2 lb Date of Birth:  [birth year]          BSA:          1.188 m Patient Age:    60 years          BP:           145/96 mmHg Patient Gender: F                 HR:           89 bpm. Exam Location:  Inpatient Procedure: 2D Echo, Cardiac Doppler and Color Doppler Indications:    Chest Pain  History:        Patient has no prior history of Echocardiogram examinations.                 Previous Myocardial Infarction;  Risk Factors:Hypertension and                 Dyslipidemia. Tobacco use, Stents x3.  Sonographer:    Wenda Low Referring Phys: [phone] HAO MENG IMPRESSIONS  1. Left ventricular ejection fraction, by estimation, is 55 to 60%. The left ventricle has normal function. The left ventricle has no regional wall motion abnormalities. There is moderate left ventricular hypertrophy. Left ventricular diastolic parameters are consistent with Grade I diastolic dysfunction (impaired relaxation).  2. Right ventricular systolic function is normal. The right ventricular size is normal.  3. The mitral valve is normal in structure. No evidence of mitral valve regurgitation. No evidence of mitral  stenosis.  4. The aortic valve is tricuspid. Aortic valve regurgitation is not visualized. Aortic valve sclerosis is present, with no evidence of aortic valve stenosis.  5. The inferior vena cava is normal in size with greater than 50% respiratory variability, suggesting right atrial pressure of 3 mmHg. Comparison(s): No prior Echocardiogram. FINDINGS  Left Ventricle: Left ventricular ejection fraction, by estimation, is 55 to 60%. The left ventricle has normal function. The left ventricle has no regional wall motion abnormalities. The left ventricular internal cavity size was normal in size. There is  moderate left ventricular hypertrophy. Left ventricular diastolic parameters are consistent with Grade I diastolic dysfunction (impaired relaxation). Right Ventricle: The right ventricular size is normal. Right ventricular systolic function is normal. Left Atrium: Left atrial size was normal in size. Right Atrium: Right atrial size was normal in size. Pericardium: There is no evidence of pericardial effusion. Mitral Valve: The mitral valve is normal in structure. Mild mitral annular calcification. No evidence of mitral valve regurgitation. No evidence of mitral valve stenosis. MV peak gradient, 2.5 mmHg. The mean mitral valve gradient is 1.0 mmHg. Tricuspid Valve: The tricuspid valve is normal in structure. Tricuspid valve regurgitation is trivial. No evidence of tricuspid stenosis. Aortic Valve: The aortic valve is tricuspid. Aortic valve regurgitation is not visualized. Aortic valve sclerosis is present, with no evidence of aortic valve stenosis. Aortic valve mean gradient measures 3.0 mmHg. Aortic valve peak gradient measures 5.3  mmHg. Aortic valve area, by VTI measures 1.83 cm. Pulmonic Valve: The pulmonic valve was not well visualized. Pulmonic valve regurgitation is not visualized. No evidence of pulmonic stenosis. Aorta: The aortic root is normal in size and structure. Venous: The inferior vena cava is normal  in size with greater than 50% respiratory variability, suggesting right atrial pressure of 3 mmHg. IAS/Shunts: No atrial level shunt detected by color flow Doppler.  LEFT VENTRICLE PLAX 2D LVIDd:         2.90 cm     Diastology LVIDs:         2.00 cm     LV e' medial:    4.56 cm/s LV PW:         1.00 cm     LV E/e' medial:  13.9 LV IVS:        1.00 cm     LV e' lateral:   6.66 cm/s LVOT diam:     1.80 cm     LV E/e' lateral: 9.5 LV SV:         38 LV SV Index:   32 LVOT Area:     2.54 cm  LV Volumes (MOD) LV vol d, MOD A2C: 20.5 ml LV vol d, MOD A4C: 22.2 ml LV vol s, MOD A2C: 9.7 ml LV vol s, MOD A4C: 11.1 ml LV SV MOD  A2C:     10.8 ml LV SV MOD A4C:     22.2 ml LV SV MOD BP:      11.4 ml LEFT ATRIUM           Index        RIGHT ATRIUM          Index LA diam:      1.80 cm 1.52 cm/m   RA Area:     5.56 cm LA Vol (A4C): 12.9 ml 10.86 ml/m  RA Volume:   7.23 ml  6.09 ml/m  AORTIC VALVE                    PULMONIC VALVE AV Area (Vmax):    2.23 cm     PV Vmax:       1.19 m/s AV Area (Vmean):   1.85 cm     PV Peak grad:  5.7 mmHg AV Area (VTI):     1.83 cm AV Vmax:           115.00 cm/s AV Vmean:          81.000 cm/s AV VTI:            0.207 m AV Peak Grad:      5.3 mmHg AV Mean Grad:      3.0 mmHg LVOT Vmax:         101.00 cm/s LVOT Vmean:        59.000 cm/s LVOT VTI:          0.149 m LVOT/AV VTI ratio: 0.72  AORTA Ao Root diam: 2.80 cm MITRAL VALVE MV Area (PHT): 2.38 cm    SHUNTS MV Area VTI:   1.67 cm    Systemic VTI:  0.15 m MV Peak grad:  2.5 mmHg    Systemic Diam: 1.80 cm MV Mean grad:  1.0 mmHg MV Vmax:       0.79 m/s MV Vmean:      43.7 cm/s MV Decel Time: 319 msec MV E velocity: 63.40 cm/s MV A velocity: 84.90 cm/s MV E/A ratio:  0.75 Kirk Ruths MD Electronically signed by Kirk Ruths MD Signature Date/Time: 06/07/2021/3:45:10 PM    Final     Cardiac Studies   Cardiac Cath-PCI 06/03/2021: Mid RCA 100% occlusion - ostial to distal mid RCA PCI with 3 overlapping DES stents (distal 2.5 x 30, mid  3.0 x 18, ostial-proximal 3.5 x 22 Onyx Frontier DES) but unable to pass stent to the distal 70% stenosis (very angulated).  Also noted ostial LCx 70 to 80% and OM1 mid 70% (recommended medical therapy).. Cardiac Cath 06/07/2021: mid-distal LM 30%-< Ost LCx ~80% (stable-not PCI target due to trifurcation lesion), mid LCx 65% with OM1 ~70%; Ost-prox LAD 40% with Prox-mid LaD 30%. Ostial-distal Mid RCA stents present with ~30% mid RCA stent stenosis (mild tissue prolapse)--stable tandem 70 and 50% stenoses in the distal RCA beyond the stents.  Low LVEDP. => Stable findings, recommend optimize medical therapy.  Toprol dose increased and oral nitrate added.   TTE 06/07/2021: EF 55-60%.  Normal LV function with no R WMA.  GR 1 DD.  Aortic valve sclerosis but no stenosis.  Normal RVP.-Normal echo.  Patient Profile     60 y.o. female with past medical history of tobacco abuse and hypertension who is being seen 06/07/2021 for the evaluation of chest pressure and SOB.  Assessment & Plan    NSTEMI CAD s/p recent DESx3 RCA - recent stenting  on ASA and Brilinta - presented with chest pain and HS trop elevated - repeat cath showed stent patency in the RCA.  - BB increased to 75mg  BID>>Increase to 100mg  daily - Imdur 30mg  added>>may need to increase in the OP setting - Echo showed LVEF 55-60%, no WMA, moderate LVH, G1DD - right groin cath site stable - continue Imdur - will check AM labs  HLD - LDL 158 - continue statin  HTN - stop hydralazine - continue Losartan, Imdur and toprol - Increase Toprol  as above - BP overall good  Tobacco use  - recently quit smoking  Hypokalemia - s/p potassium - K 4.0  Leukocytosis -> she recently found out that her live-in significant other as well as her best friend and daughter all contracted influenza (presumptively from when they came up to see her). -->  We will check nasal swab for influenza and 2D echocardiogram based on the fact there is right-sided  rhonchi.  No signs or symptoms to suggest infectious etiology.   For questions or updates, please contact [place] Please consult [email or web address] for contact info under        Signed, Cadence Ninfa Meeker, PA-C  06/09/2021, 7:54 AM      ATTENDING ATTESTATION  I have seen, examined and evaluated the patient this AM along with Cadence Kathlen Mody, PA .  After reviewing all the available data and chart, we discussed the patients laboratory, study & physical findings as well as symptoms in detail. I agree with her findings, examination as well as impression recommendations as per our discussion.    Attending adjustments noted in italics.   Overall pretty stable.  Anticipate that she may be ready to go home in the next day or so.  I am a little bit concerned about the white blood cell count and the fact that her family members are all now positive with influenza.  With her recent MI and now readmission with recurrent MI, I think it would make sense for her to have a stable place to go home without having her caregivers are positive with influenza.  Would like to monitor for least 1 more day to ensure that she herself is not developing signs or symptoms of influenza with her modest leukocytosis.  Making medication adjustments as noted to simplify medications.  Titrating up beta-blocker, weaning off hydralazine and using higher dose losartan along with Imdur.  Reassess tomorrow.  I have contacted clinical social worker to assist with discharge planning.  The patient will need a ride home and may require some home health assistance in the short-term while her home caregivers are recuperating from influenza.    Glenetta Hew, M.D., M.S. Interventional Cardiologist   Pager # [phone] Phone # [phone] [address] [place], [place] [address]

## 2021-06-09 NOTE — Progress Notes (Signed)
Mobility Specialist Progress Note    06/09/21 1634  Mobility  Activity Ambulated in hall  Level of Assistance Standby assist, set-up cues, supervision of patient - no hands on  Assistive Device Front wheel walker  Distance Ambulated (ft) 960 ft  Mobility Ambulated with assistance in hallway  Mobility Response Tolerated well  Mobility performed by Mobility specialist  $Mobility charge 1 Mobility   Joined pt in hallway. No complaints on walk. Returned to bed with call bell in reach.   [facility] Mobility Specialist  M.S. Primary Phone: [phone] M.S. Secondary Phone: [phone]

## 2021-06-09 NOTE — Discharge Summary (Addendum)
Discharge Summary    Patient ID: ROBBYE DEDE MRN: [id number]; DOB: 25-Sep-1960  Admit date: 06/07/2021 Discharge date: 06/10/2021  PCP:  Default, Provider, MD   [facility] Providers Cardiologist:  None     Discharge Diagnoses    Principal Problem:   NSTEMI (non-ST elevated myocardial infarction) [facility]) Active Problems:   ST elevation myocardial infarction (STEMI) of inferior wall, subsequent episode of care [facility])   Coronary artery disease involving native coronary artery of native heart with unstable angina pectoris ([place])   Primary hypertension   Hyperlipidemia LDL goal <70   Tobacco use   Left carotid artery stenosis   Presence of drug coated stent in right coronary artery   Hypokalemia   Dysphagia    Diagnostic Studies/Procedures    Cardiac Catheterization 06/07/2021:   Mid LM to Dist LM lesion is 30% stenosed.   Ost LAD to Prox LAD lesion is 40% stenosed.   Prox LAD to Mid LAD lesion is 30% stenosed.   Mid Cx lesion is 65% stenosed.   Prox RCA to Mid RCA lesion is 30% stenosed.   Mid RCA lesion is 70% stenosed.   Dist RCA lesion is 50% stenosed.   1st Mrg lesion is 70% stenosed.   Ost Cx lesion is 80% stenosed.   Non-stenotic Ost RCA lesion was previously treated.   LV end diastolic pressure is normal.   Continued patency of the stents in the RCA. There is mild tissue prolapse in the mid stent but this is not flow limiting. There is no change in the 70% stenosis distal to the stent in a very tortuous segment. The patient does have moderate to severe disease at the ostium of the LCx that may be the source of her symptoms. This is not amenable to PCI given trifurcation. Low LVEDP   Plan: optimize medical therapy. Increase Toprol XL dose and add oral nitrates. Check Echo.    Coronary Diagrams  Diagnostic Dominance: Right   ____________  Echocardiogram 06/07/2021:  1. Left ventricular ejection fraction, by estimation, is 55 to 60%. The  left  ventricle has normal function. The left ventricle has no regional  wall motion abnormalities. There is moderate left ventricular hypertrophy.  Left ventricular diastolic  parameters are consistent with Grade I diastolic dysfunction (impaired  relaxation).   2. Right ventricular systolic function is normal. The right ventricular  size is normal.   3. The mitral valve is normal in structure. No evidence of mitral valve  regurgitation. No evidence of mitral stenosis.   4. The aortic valve is tricuspid. Aortic valve regurgitation is not  visualized. Aortic valve sclerosis is present, with no evidence of aortic  valve stenosis.   5. The inferior vena cava is normal in size with greater than 50%  respiratory variability, suggesting right atrial pressure of 3 mmHg.    History of Present Illness     Carrie Butler is a 60 y.o. female with past medical history of recent inferior STEMI on 06/03/2021 s/p DES to RCA, hypertension, hyperlipidemia, and tobacco use who was admitted on 06/07/2021 with NSTEMI after presenting with shortness of breath and chest pressure.  Patient was recently admitted on 06/03/2021 for inferior STEMI.  Emergent cardiac catheterization at that time showed total occlusion of mid RCA (the vessel was very tortuous), 70% stenosis of ostial left circumflex, 30% stenosis of left main. Patient underwent successful PCI with DES x3 to the RCA lesion. LVEF was 40% by cath. She was started on DAPT with  Aspirin and Brilinta.Patient also had carotid dopplers performed this admission which showed 80-90% stenosis of left ICA and 1-39% stenosis of right ICA. Given she was asymptomatic, she was referred to Vascular Surgery as an outpatient. Of note, there was concern for medication compliance due to patient being skeptical of medical therapy and preferring naturopathic and cannabis based therapy instead. She was counseled in great detail on the importance of DAPT. She was discharged on  06/05/2021.  She returned to the ED on 05/28/2021 with recurrent chest pain and shortness of breath. She reported compliance with DAPT since leaving the hospital. High-sensitivity troponin was elevated at 4,438. EKG showed minimal ST downsloping in lateral leads (specifically V5-V6) and minimal J piont elevation in leads II and III. Decision was made to proceed with urgent cardiac catheterization.   Hospital Course     Consultants: None  NSTEMI Recent STEMI CAD Patient admitted on 06/07/2021 for NSTEMI after presenting with recurrent chest pain and shortness of breath as stated above. High-sensitivity peaked at [place]. Urgent cardiac catheterization showed patent RCA stents with mild tissue prolapse in the mid stent but not flow limiting. She does have a moderate to severe disease at the ostium of the LCX that may be source of symptoms but no amenable to PCI given trifucation. Continued medication therapy was recommended. Echo showed LVEF 55 to 60%, no wall motion abnormality, moderate LVH, grade 1 diastolic dysfunction, normal RV function. Patient tolerated procedure well. Continue DAPT with Aspirin and Brilinta for a minimum of 12 months. Toprol-XL was increased to 100mg  daily and she was started on Imdur 30mg  daily. Continue high-intensity statin.  Of note, patient did report some chest heaviness the night prior to discharge while eating. She states that since her MI when she eats she will get winded, then feel like her heart is racing, and then develop some chest tightness/heaviness. When this happened the night before discharge, she required 2 doses of sublingual Nitro with resolutions. She does thinks she has reflux and also describes dysphagia which it sounds like she has had for a while. She also states that a long time ago she was told she had a problem with the "valves" in her esophagus causing food to get stuck (sounds like she is referring to the lower esophageal sphintcer). Will add Protonix  40mg  daily to see if that helps and refer her to GI as an outpatient.  Carotid Stenosis Carotid ultrasound during recent admission showed 80-90% stenosis of left ICA and 1-39% stenosis of right ICA. Continue antiplatelet and statin therapies. She has already been referred to Vascular surgery as an outpatient.  Hypertension BP mostly well controlled this admission. Hydralazine was stopped. Continue Losartan 50mg  daily, Toprol-XL 100mg  daily, and Imdur 30mg  daily.  Hyperlipidemia Lipid panel during recent admission on 06/03/2021 showed Total Cholesterol 247, Triglycerides 49, HDL 79, LDL 158. LDL goal <55. Continue Lipitor 80mg  daily which was started during recent admission. Will need repeat lipid panel and LFTs in 2 months.  Hypokalemia Potassium 3.2 on day of discharge. Will supplement with K-Dur 60 mEq before she goes home. Recommend repeat BMET at follow-up visit next week.  Leukocytosis - Resolved WBC 17.1 on admission but normalized throughout admission. Patient does state that multiple family members/friends were recently diagnosed with the flu. She was noted to have some rhonchi on exam on 06/09/2021 so chest x-ray and respiratory panel were ordered. Chest x-ray showed no acute findings and respiratory panel was completely negative.  Tobacco Abuse Patient states she quit  smoking after recent discharged and seems very motivated to stay away from cigarettes. Congratulated patient on this and re-emphasized the importance of complete cessation.  Marijuana Use Patient admits to using marijuana which she has been doing for years to help with her BP and anxiety. Recommended stopping this.  Dysphagia As mentioned above, patient describes dysphagia and it sounds like she has previously been told that she has had problems with her lower esophageal sphincter. She has not been seen by GI and has never had a EGD. Will refer to GI as an outpatient.  Patient was seen and examined by Dr. Ellyn Hack  today and felt to be stable for discharge. Of note, patient states her husband has recovered from the flu and is feeling much better and can take her home.Outpatient follow-up has been arranged. Medications as below.   Did the patient have an acute coronary syndrome (MI, NSTEMI, STEMI, etc) this admission?:  Yes                               AHA/ACC Clinical Performance & Quality Measures: Aspirin prescribed? - Yes ADP Receptor Inhibitor (Plavix/Clopidogrel, Brilinta/Ticagrelor or Effient/Prasugrel) prescribed (includes medically managed patients)? - Yes Beta Blocker prescribed? - Yes High Intensity Statin (Lipitor 40-80mg  or Crestor 20-40mg ) prescribed? - Yes EF assessed during THIS hospitalization? - Yes For EF <40%, was ACEI/ARB prescribed? - Not Applicable (EF >/= 47%) For EF <40%, Aldosterone Antagonist (Spironolactone or Eplerenone) prescribed? - Not Applicable (EF >/= 42%) Cardiac Rehab Phase II ordered (including medically managed patients)? - Yes     The patient will be scheduled for a TOC follow up appointment in 14 days.  A message has been sent to the [facility] and Scheduling Pool at the office where the patient should be seen for follow up.  _____________  Discharge Vitals Blood pressure 138/83, pulse 70, temperature 98.1 F (36.7 C), temperature source Oral, resp. rate 18, height 4\' 10"  (1.473 m), weight 33.5 kg, SpO2 99 %.  Filed Weights   06/07/21 0603 06/08/21 1552  Weight: 33.2 kg 33.5 kg   General: 60 y.o. thin Caucasian female resting comfortably in no acute distress.  HEENT: Normocephalic and atraumatic. Sclera clear.  Neck: Supple. No JVD. Heart: RRR. Distinct S1 and S2. No murmurs, gallops, or rubs. Radial and distal pedal pulses 2+ and equal bilaterally. Right radial and right femoral cath sites soft with no signs of hematoma. Lungs: No increased work of breathing. Scattered rhonchi that cleared with coughing. Otherwise, lungs clear to ausculation. Abdomen: Soft,  non-distended, and non-tender to palpation.  Extremities: No lower extremity edema.    Skin: Warm and dry. Neuro: Alert and oriented x3. No focal deficits. Psych: Normal affect. Responds appropriately.  Labs & Radiologic Studies    CBC Recent Labs    06/09/21 0907 06/10/21 0737  WBC 8.7 7.2  HGB 11.8* 11.2*  HCT 34.8* 33.9*  MCV 87.7 89.0  PLT 390 595*   Basic Metabolic Panel Recent Labs    06/09/21 0907 06/10/21 0737  NA 134* 135  K 3.3* 3.2*  CL 105 105  CO2 19* 22  GLUCOSE 127* 149*  BUN 16 18  CREATININE 0.86 0.94  CALCIUM 8.9 8.8*   Liver Function Tests No results for input(s): AST, ALT, ALKPHOS, BILITOT, PROT, ALBUMIN in the last 72 hours.  No results for input(s): LIPASE, AMYLASE in the last 72 hours. High Sensitivity Troponin:   Recent Labs  Lab  06/03/21 1144 06/07/21 0645 06/07/21 0829 06/07/21 1058 06/07/21 1407  TROPONINIHS 163* 4,438* 3,851* 3,905* 4,249*    BNP Invalid input(s): POCBNP D-Dimer No results for input(s): DDIMER in the last 72 hours. Hemoglobin A1C No results for input(s): HGBA1C in the last 72 hours. Fasting Lipid Panel No results for input(s): CHOL, HDL, LDLCALC, TRIG, CHOLHDL, LDLDIRECT in the last 72 hours. Thyroid Function Tests No results for input(s): TSH, T4TOTAL, T3FREE, THYROIDAB in the last 72 hours.  Invalid input(s): FREET3 _____________  DG Chest 2 View  Result Date: 06/09/2021 CLINICAL DATA:  Right-sided rhonchi on exam EXAM: CHEST - 2 VIEW COMPARISON:  06/07/2021 FINDINGS: The heart size and mediastinal contours are within normal limits. Both lungs are clear. Probable chronic fracture deformity at the left humeral neck. IMPRESSION: No active cardiopulmonary disease. Electronically Signed   By: Donavan Foil M.D.   On: 06/09/2021 15:26   CARDIAC CATHETERIZATION  Result Date: 06/07/2021   Mid LM to Dist LM lesion is 30% stenosed.   Ost LAD to Prox LAD lesion is 40% stenosed.   Prox LAD to Mid LAD lesion is 30%  stenosed.   Mid Cx lesion is 65% stenosed.   Prox RCA to Mid RCA lesion is 30% stenosed.   Mid RCA lesion is 70% stenosed.   Dist RCA lesion is 50% stenosed.   1st Mrg lesion is 70% stenosed.   Ost Cx lesion is 80% stenosed.   Non-stenotic Ost RCA lesion was previously treated.   LV end diastolic pressure is normal. Continued patency of the stents in the RCA. There is mild tissue prolapse in the mid stent but this is not flow limiting. There is no change in the 70% stenosis distal to the stent in a very tortuous segment. The patient does have moderate to severe disease at the ostium of the LCx that may be the source of her symptoms. This is not amenable to PCI given trifurcation. Low LVEDP Plan: optimize medical therapy. Increase Toprol XL dose and add oral nitrates. Check Echo.   CARDIAC CATHETERIZATION  Result Date: 06/03/2021   A stent was successfully placed.   Post intervention, there is a 50% residual stenosis.   Post intervention, there is a 70% residual stenosis. Acute inferior ST elevation MI due to total mid vessel occlusion Successful complicated procedure requiring guide catheter extension, double wire, and overlapping stenting from distal vessel to the ostium of RCA.  3 stents used.  Residual stenosis of approximately 30% in the mid vessel due to prolapse of tissue at site of stent overlap.  The ostial RCA stent overhangs into the aorta significantly.  Not likely to be able to selectively engage in the future.  Beyond the stented segment there is a angulated 50 to 70% stenosis. Ostial 70 to 80% circumflex.  First obtuse marginal 70% mid.  Mid circumflex 50%. Distal left main 20 to 30%. Ostial LAD 20 to 30%.  30% mid. Moderate inferobasal hypokinesis with EF 40%. RECOMMENDATIONS: Aggressive risk factor modification including smoking cessation, and encouraging the patient to use medications for control of blood pressure and cholesterol. High risk for recurrent acute ischemic events due to phenotype  and conventional medical therapy refusal   DG CHEST PORT 1 VIEW  Result Date: 06/07/2021 CLINICAL DATA:  Chest pressure EXAM: PORTABLE CHEST 1 VIEW COMPARISON:  06/03/2021 FINDINGS: Lungs are clear.  No pleural effusion or pneumothorax. The heart is normal in size. IMPRESSION: No evidence of acute cardiopulmonary disease. Electronically Signed   By: Bertis Ruddy  [place] Pink M.D.   On: 06/07/2021 07:22   DG CHEST PORT 1 VIEW  Result Date: 06/03/2021 CLINICAL DATA:  Shortness of breath EXAM: PORTABLE CHEST 1 VIEW COMPARISON:  None. FINDINGS: The heart size and mediastinal contours are within normal limits. Both lungs are clear. The visualized skeletal structures are unremarkable. Bilateral nipple shadows are noted. IMPRESSION: No active disease. Electronically Signed   By: Inez Catalina M.D.   On: 06/03/2021 19:15   ECHOCARDIOGRAM COMPLETE  Result Date: 06/07/2021    ECHOCARDIOGRAM REPORT   Patient Name:   Carrie Butler [facility] Date of Exam: 06/07/2021 Medical Rec #:  [id number]         Height:       58.0 in Accession #:    [id number]        Weight:       73.2 lb Date of Birth:  March 27, 1961          BSA:          1.188 m Patient Age:    60 years          BP:           145/96 mmHg Patient Gender: F                 HR:           89 bpm. Exam Location:  Inpatient Procedure: 2D Echo, Cardiac Doppler and Color Doppler Indications:    Chest Pain  History:        Patient has no prior history of Echocardiogram examinations.                 Previous Myocardial Infarction; Risk Factors:Hypertension and                 Dyslipidemia. Tobacco use, Stents x3.  Sonographer:    Wenda Low Referring Phys: [phone] HAO MENG IMPRESSIONS  1. Left ventricular ejection fraction, by estimation, is 55 to 60%. The left ventricle has normal function. The left ventricle has no regional wall motion abnormalities. There is moderate left ventricular hypertrophy. Left ventricular diastolic parameters are consistent with Grade I diastolic  dysfunction (impaired relaxation).  2. Right ventricular systolic function is normal. The right ventricular size is normal.  3. The mitral valve is normal in structure. No evidence of mitral valve regurgitation. No evidence of mitral stenosis.  4. The aortic valve is tricuspid. Aortic valve regurgitation is not visualized. Aortic valve sclerosis is present, with no evidence of aortic valve stenosis.  5. The inferior vena cava is normal in size with greater than 50% respiratory variability, suggesting right atrial pressure of 3 mmHg. Comparison(s): No prior Echocardiogram. FINDINGS  Left Ventricle: Left ventricular ejection fraction, by estimation, is 55 to 60%. The left ventricle has normal function. The left ventricle has no regional wall motion abnormalities. The left ventricular internal cavity size was normal in size. There is  moderate left ventricular hypertrophy. Left ventricular diastolic parameters are consistent with Grade I diastolic dysfunction (impaired relaxation). Right Ventricle: The right ventricular size is normal. Right ventricular systolic function is normal. Left Atrium: Left atrial size was normal in size. Right Atrium: Right atrial size was normal in size. Pericardium: There is no evidence of pericardial effusion. Mitral Valve: The mitral valve is normal in structure. Mild mitral annular calcification. No evidence of mitral valve regurgitation. No evidence of mitral valve stenosis. MV peak gradient, 2.5 mmHg. The mean mitral valve gradient is 1.0 mmHg. Tricuspid Valve: The tricuspid valve is  normal in structure. Tricuspid valve regurgitation is trivial. No evidence of tricuspid stenosis. Aortic Valve: The aortic valve is tricuspid. Aortic valve regurgitation is not visualized. Aortic valve sclerosis is present, with no evidence of aortic valve stenosis. Aortic valve mean gradient measures 3.0 mmHg. Aortic valve peak gradient measures 5.3  mmHg. Aortic valve area, by VTI measures 1.83 cm.  Pulmonic Valve: The pulmonic valve was not well visualized. Pulmonic valve regurgitation is not visualized. No evidence of pulmonic stenosis. Aorta: The aortic root is normal in size and structure. Venous: The inferior vena cava is normal in size with greater than 50% respiratory variability, suggesting right atrial pressure of 3 mmHg. IAS/Shunts: No atrial level shunt detected by color flow Doppler.  LEFT VENTRICLE PLAX 2D LVIDd:         2.90 cm     Diastology LVIDs:         2.00 cm     LV e' medial:    4.56 cm/s LV PW:         1.00 cm     LV E/e' medial:  13.9 LV IVS:        1.00 cm     LV e' lateral:   6.66 cm/s LVOT diam:     1.80 cm     LV E/e' lateral: 9.5 LV SV:         38 LV SV Index:   32 LVOT Area:     2.54 cm  LV Volumes (MOD) LV vol d, MOD A2C: 20.5 ml LV vol d, MOD A4C: 22.2 ml LV vol s, MOD A2C: 9.7 ml LV vol s, MOD A4C: 11.1 ml LV SV MOD A2C:     10.8 ml LV SV MOD A4C:     22.2 ml LV SV MOD BP:      11.4 ml LEFT ATRIUM           Index        RIGHT ATRIUM          Index LA diam:      1.80 cm 1.52 cm/m   RA Area:     5.56 cm LA Vol (A4C): 12.9 ml 10.86 ml/m  RA Volume:   7.23 ml  6.09 ml/m  AORTIC VALVE                    PULMONIC VALVE AV Area (Vmax):    2.23 cm     PV Vmax:       1.19 m/s AV Area (Vmean):   1.85 cm     PV Peak grad:  5.7 mmHg AV Area (VTI):     1.83 cm AV Vmax:           115.00 cm/s AV Vmean:          81.000 cm/s AV VTI:            0.207 m AV Peak Grad:      5.3 mmHg AV Mean Grad:      3.0 mmHg LVOT Vmax:         101.00 cm/s LVOT Vmean:        59.000 cm/s LVOT VTI:          0.149 m LVOT/AV VTI ratio: 0.72  AORTA Ao Root diam: 2.80 cm MITRAL VALVE MV Area (PHT): 2.38 cm    SHUNTS MV Area VTI:   1.67 cm    Systemic VTI:  0.15 m MV Peak grad:  2.5 mmHg  Systemic Diam: 1.80 cm MV Mean grad:  1.0 mmHg MV Vmax:       0.79 m/s MV Vmean:      43.7 cm/s MV Decel Time: 319 msec MV E velocity: 63.40 cm/s MV A velocity: 84.90 cm/s MV E/A ratio:  0.75 Kirk Ruths MD Electronically  signed by Kirk Ruths MD Signature Date/Time: 06/07/2021/3:45:10 PM    Final    VAS US CAROTID  Result Date: 06/06/2021 Carotid Arterial Duplex Study Patient Name:  BERT GIVANS [facility]  Date of Exam:   06/05/2021 Medical Rec #: [id number]          Accession #:    [id number] Date of Birth: February 17, 1961           Patient Gender: F Patient Age:   60 years Exam Location:  [facility] Procedure:      VAS US CAROTID Referring Phys: Ria Comment ROBERTS --------------------------------------------------------------------------------  Indications:       Left bruit. Risk Factors:      Hypertension, hyperlipidemia, current smoker. Comparison Study:  No prior studies. Performing Technologist: Oliver Hum RVT  Examination Guidelines: A complete evaluation includes B-mode imaging, spectral Doppler, color Doppler, and power Doppler as needed of all accessible portions of each vessel. Bilateral testing is considered an integral part of a complete examination. Limited examinations for reoccurring indications may be performed as noted.  Right Carotid Findings: +----------+--------+--------+--------+-----------------------+--------+             PSV cm/s EDV cm/s Stenosis Plaque Description      Comments  +----------+--------+--------+--------+-----------------------+--------+  CCA Prox   101      19                smooth and heterogenous           +----------+--------+--------+--------+-----------------------+--------+  CCA Distal 81       25                smooth and heterogenous           +----------+--------+--------+--------+-----------------------+--------+  ICA Prox   138      32                calcific                          +----------+--------+--------+--------+-----------------------+--------+  ICA Distal 66       23                                                  +----------+--------+--------+--------+-----------------------+--------+  ECA        163      20                                                   +----------+--------+--------+--------+-----------------------+--------+ +----------+--------+-------+--------+-------------------+             PSV cm/s EDV cms Describe Arm Pressure (mmHG)  +----------+--------+-------+--------+-------------------+  Subclavian 143                                            +----------+--------+-------+--------+-------------------+ +---------+--------+--+--------+--+---------+  Vertebral PSV cm/s 26 EDV cm/s 11 Antegrade  +---------+--------+--+--------+--+---------+  Left Carotid Findings: +----------+--------+--------+--------+-----------------------+--------+             PSV cm/s EDV cm/s Stenosis Plaque Description      Comments  +----------+--------+--------+--------+-----------------------+--------+  CCA Prox   72       19                smooth and heterogenous           +----------+--------+--------+--------+-----------------------+--------+  CCA Distal 67       31                smooth and heterogenous           +----------+--------+--------+--------+-----------------------+--------+  ICA Prox   139      66                calcific                          +----------+--------+--------+--------+-----------------------+--------+  ICA Mid    257      124               calcific                          +----------+--------+--------+--------+-----------------------+--------+  ICA Distal 74       31                                                  +----------+--------+--------+--------+-----------------------+--------+  ECA        325      45                                                  +----------+--------+--------+--------+-----------------------+--------+ +----------+--------+--------+--------+-------------------+             PSV cm/s EDV cm/s Describe Arm Pressure (mmHG)  +----------+--------+--------+--------+-------------------+  Subclavian 168                                             +----------+--------+--------+--------+-------------------+  +---------+--------+--+--------+--+---------+  Vertebral PSV cm/s 17 EDV cm/s 10 Antegrade  +---------+--------+--+--------+--+---------+   Summary: Right Carotid: Velocities in the right ICA are consistent with a 1-39% stenosis. Left Carotid: Velocities in the left ICA are consistent with a 80-99% stenosis. Vertebrals: Bilateral vertebral arteries demonstrate antegrade flow. *See table(s) above for measurements and observations.  Electronically signed by Jamelle Haring on 06/06/2021 at 3:28:40 PM.    Final    Disposition   Patient is being discharged home today in good condition.  Follow-up Plans & Appointments     Follow-up Information     Richardo Priest, MD Follow up.   Specialties: Cardiology, Radiology Why: Hospital follow-up with Cardiology scheduled for 06/18/2021 at 1:20pm. Please arrive 15 minutes early for check-in. If this date/time does not work for you, please call our office to reschedule. Contact information: [place] [address] [phone]  Discharge Instructions     Ambulatory referral to Gastroenterology   Complete by: As directed    Dysphagia and reflux       Discharge Medications   Allergies as of 06/10/2021   No Known Allergies      Medication List     STOP taking these medications    hydrALAZINE 25 MG tablet Commonly known as: APRESOLINE       TAKE these medications    aspirin 81 MG chewable tablet Chew 1 tablet (81 mg total) by mouth daily.   atorvastatin 80 MG tablet Commonly known as: LIPITOR Take 1 tablet (80 mg total) by mouth daily.   isosorbide mononitrate 30 MG 24 hr tablet Commonly known as: IMDUR Take 1 tablet (30 mg total) by mouth daily.   losartan 50 MG tablet Commonly known as: COZAAR Take 1 tablet (50 mg total) by mouth daily.   metoprolol succinate 100 MG 24 hr tablet Commonly known as: TOPROL-XL Take 1 tablet (100 mg total) by mouth daily. Take with or  immediately following a meal. What changed:  medication strength how much to take Notes to patient: Finish your home supply of metoprolol 50mg  by taking 2 tablets (100mg ) daily. Call your pharmacy when ready for a refill, a prescription was sent for the new dose.    nitroGLYCERIN 0.4 MG SL tablet Commonly known as: Nitrostat Place 1 tablet (0.4 mg total) under the tongue every 5 (five) minutes as needed for chest pain.   pantoprazole 40 MG tablet Commonly known as: Protonix Take 1 tablet (40 mg total) by mouth daily.   ticagrelor 90 MG Tabs tablet Commonly known as: BRILINTA Take 1 tablet (90 mg total) by mouth 2 (two) times daily.           Outstanding Labs/Studies   Repeat BMET at follow-up visit. Repeat lipid panel and LFTs in 2 months.  Duration of Discharge Encounter   Greater than 30 minutes including physician time.  Signed, Darreld Mclean, PA-C 06/10/2021, 8:52 AM   Patient was seen and examined along with Sande Rives, PA on rounds the morning of discharge.  She was feeling well.  Doing well.  She also noted that the home situation is much improved, indicating that her significant other was feeling much better with his influenza infection.  She herself is also feeling more stable.  Her chest discomfort symptoms following eating are improved with PPI.  Overall, symptoms are doing relatively well.  Stable for discharge agree with findings, examination and summary noted above.   Glenetta Hew, MD

## 2021-06-10 ENCOUNTER — Telehealth (HOSPITAL_COMMUNITY): Payer: Self-pay

## 2021-06-10 DIAGNOSIS — E876 Hypokalemia: Secondary | ICD-10-CM

## 2021-06-10 DIAGNOSIS — I214 Non-ST elevation (NSTEMI) myocardial infarction: Secondary | ICD-10-CM | POA: Diagnosis not present

## 2021-06-10 DIAGNOSIS — R1310 Dysphagia, unspecified: Secondary | ICD-10-CM

## 2021-06-10 DIAGNOSIS — I25110 Atherosclerotic heart disease of native coronary artery with unstable angina pectoris: Secondary | ICD-10-CM | POA: Diagnosis not present

## 2021-06-10 HISTORY — DX: Dysphagia, unspecified: R13.10

## 2021-06-10 HISTORY — DX: Hypokalemia: E87.6

## 2021-06-10 LAB — CBC
HCT: 33.9 % — ABNORMAL LOW (ref 36.0–46.0)
Hemoglobin: 11.2 g/dL — ABNORMAL LOW (ref 12.0–15.0)
MCH: 29.4 pg (ref 26.0–34.0)
MCHC: 33 g/dL (ref 30.0–36.0)
MCV: 89 fL (ref 80.0–100.0)
Platelets: 408 10*3/uL — ABNORMAL HIGH (ref 150–400)
RBC: 3.81 MIL/uL — ABNORMAL LOW (ref 3.87–5.11)
RDW: 14.1 % (ref 11.5–15.5)
WBC: 7.2 10*3/uL (ref 4.0–10.5)
nRBC: 0 % (ref 0.0–0.2)

## 2021-06-10 LAB — BASIC METABOLIC PANEL
Anion gap: 8 (ref 5–15)
BUN: 18 mg/dL (ref 6–20)
CO2: 22 mmol/L (ref 22–32)
Calcium: 8.8 mg/dL — ABNORMAL LOW (ref 8.9–10.3)
Chloride: 105 mmol/L (ref 98–111)
Creatinine, Ser: 0.94 mg/dL (ref 0.44–1.00)
GFR, Estimated: >60 mL/min (ref >60)
Glucose, Bld: 149 mg/dL — ABNORMAL HIGH (ref 70–99)
Potassium: 3.2 mmol/L — ABNORMAL LOW (ref 3.5–5.1)
Sodium: 135 mmol/L (ref 135–145)

## 2021-06-10 MED ORDER — POTASSIUM CHLORIDE CRYS ER 20 MEQ PO TBCR
60.0000 meq | EXTENDED_RELEASE_TABLET | Freq: Once | ORAL | Status: AC
  Administered 2021-06-10: 11:00:00 60 meq via ORAL
  Filled 2021-06-10: qty 3

## 2021-06-10 MED ORDER — PANTOPRAZOLE SODIUM 40 MG PO TBEC: 40.0000 mg | DELAYED_RELEASE_TABLET | Freq: Every day | ORAL | 2 refills | Status: DC

## 2021-06-10 NOTE — Discharge Instructions (Addendum)
Medication Changes: - START Imdur 30mg  daily. - INCREASE Metoprolol succinate (Toprol-XL) to 100mg  daily. - STOP Hydralazine. - CONTINUE Aspirin 81mg  daily and Brilinta 90mg  twice daily. These medications are very important in helping keep the stents in your heart open.  - START Protonix 40mg  daily for reflux.  Post Myocardial Infarction: NO HEAVY LIFTING X 4 WEEKS. NO SEXUAL ACTIVITY X 4 WEEKS. NO DRIVING X 2 WEEKS. NO SOAKING BATHS, HOT TUBS, POOLS, ETC., X 7 DAYS.  Groin Site Care: Refer to this sheet in the next few weeks. These instructions provide you with information on caring for yourself after your procedure. Your caregiver may also give you more specific instructions. Your treatment has been planned according to current medical practices, but problems sometimes occur. Call your caregiver if you have any problems or questions after your procedure. HOME CARE INSTRUCTIONS You may shower 24 hours after the procedure. Remove the bandage (dressing) and gently wash the site with plain soap and water. Gently pat the site dry.  Do not apply powder or lotion to the site.  Do not sit in a bathtub, swimming pool, or whirlpool for 5 to 7 days.  No bending, squatting, or lifting anything over 10 pounds (4.5 kg) as directed by your caregiver.  Inspect the site at least twice daily.  Do not drive home if you are discharged the same day of the procedure. Have someone else drive you.  What to expect: Any bruising will usually fade within 1 to 2 weeks.  Blood that collects in the tissue (hematoma) may be painful to the touch. It should usually decrease in size and tenderness within 1 to 2 weeks.  SEEK IMMEDIATE MEDICAL CARE IF: You have unusual pain at the groin site or down the affected leg.  You have redness, warmth, swelling, or pain at the groin site.  You have drainage (other than a small amount of blood on the dressing).  You have chills.  You have a fever or persistent symptoms for more  than 72 hours.  You have a fever and your symptoms suddenly get worse.  Your leg becomes pale, cool, tingly, or numb.  You have heavy bleeding from the site. Hold pressure on the site.

## 2021-06-10 NOTE — Telephone Encounter (Signed)
Per phase I cardiac rehab, fax cardiac rehab referral to [facility]. °

## 2021-06-11 ENCOUNTER — Telehealth: Payer: Self-pay | Admitting: Physician Assistant

## 2021-06-11 NOTE — Telephone Encounter (Signed)
° °  Patient was discharged on 12/29.  She was put on Protonix for GERD symptoms while she was in the hospital, and discharged on this medication.  However, her home [facility] pharmacy did not fill it because her insurance would not cover it and they wanted to switch her to another 1.  She was not told which 1.  Advised her that pantoprazole which is the generic for Protonix is over-the-counter at 20 mg a day while the prescription strength is 40 mg a day.  Advised her that I could not contact the office and do anything about the medication right now.  However, if she could go to the drugstore and get the OTC strength Protonix and take 2 tablets a day, that would be an equivalent.  If she could talk to the pharmacy and find out which medication her insurance company wanted to switch her to, that would also be helpful.  Suggested that next week she called the office and give them this information and then let the providers decide how they want to handle it.  She is in agreement with this plan of care and grateful for the call.  Rosaria Ferries, PA-C 06/11/2021 6:16 PM

## 2021-06-16 ENCOUNTER — Other Ambulatory Visit (HOSPITAL_COMMUNITY): Payer: Self-pay

## 2021-06-16 ENCOUNTER — Telehealth (HOSPITAL_COMMUNITY): Payer: Self-pay

## 2021-06-16 NOTE — Telephone Encounter (Signed)
Transitions of Care Pharmacy  ° °Call attempted for a pharmacy transitions of care follow-up. HIPAA appropriate voicemail was left with call back information provided.  ° °Call attempt #1. Will follow-up in 2-3 days.  °  °

## 2021-06-17 ENCOUNTER — Telehealth (HOSPITAL_COMMUNITY): Payer: Self-pay | Admitting: Pharmacist

## 2021-06-17 ENCOUNTER — Other Ambulatory Visit (HOSPITAL_COMMUNITY): Payer: Self-pay

## 2021-06-17 NOTE — Progress Notes (Signed)
Cardiology Office Note:    Date:  06/18/2021   ID:  Carrie Butler, DOB 1960/10/22, MRN [id number]  PCP:  Default, Provider, MD  Cardiologist:  Shirlee More, MD   Referring MD: No ref. provider found  ASSESSMENT:    1. Coronary artery disease involving native coronary artery of native heart with other form of angina pectoris ([place])   2. Hyperlipidemia LDL goal <70   3. Elevated lipoprotein(a)   4. Primary hypertension    PLAN:    In order of problems listed above:  Stable she has residual CAD as described and will need ongoing medical treatment dual antiplatelet therapy uninterrupted 12 months oral nitrate beta-blocker and lipid-lowering.  She will start cardiac rehab next week Continue high intensity statin reassess lipids and LP(a) and if not at target combined therapy PCSK9 inhibitor.  I told her to have her children screened for the genetic disorder LP(a) Stable BP at target  Next appointment 6 weeks [place] office   Medication Adjustments/Labs and Tests Ordered: Current medicines are reviewed at length with the patient today.  Concerns regarding medicines are outlined above.  No orders of the defined types were placed in this encounter.  No orders of the defined types were placed in this encounter.   Chief complaint follow-up after multiple admissions to the hospital with inferior ST elevation MI 2 coronary angiograms  History of Present Illness:    Carrie Butler is a 61 y.o. female who is being seen today for continuity of cardiac care after presenting to the hospital with ST elevation MI and undergoing PCI and 3 drug-eluting stents to the right coronary artery 06/03/2021.  Echocardiogram showed preserved ejection fraction with moderate LVH other problems include hyperlipidemia hypertension hypokalemia that was repleted and tobacco use with recent abstinence from cigarette smoking.  He had residual disease distal to the multiple stents in the right coronary artery and  the ostium the left circumflex felt to be not amenable to PCI and best treated medically.  She also had significant baseline elevation hyper LP(a).  Cardiac Studies    Cardiac Cath-PCI 06/03/2021: Mid RCA 100% occlusion - ostial to distal mid RCA PCI with 3 overlapping DES stents (distal 2.5 x 30, mid 3.0 x 18, ostial-proximal 3.5 x 22 Onyx Frontier DES) but unable to pass stent to the distal 70% stenosis (very angulated).  Also noted ostial LCx 70 to 80% and OM1 mid 70% (recommended medical therapy).. Cardiac Cath 06/07/2021: mid-distal LM 30%-< Ost LCx ~80% (stable-not PCI target due to trifurcation lesion), mid LCx 65% with OM1 ~70%; Ost-prox LAD 40% with Prox-mid LaD 30%. Ostial-distal Mid RCA stents present with ~30% mid RCA stent stenosis (mild tissue prolapse)--stable tandem 70 and 50% stenoses in the distal RCA beyond the stents.  Low LVEDP. => Stable findings, recommend optimize medical therapy.  Toprol dose increased and oral nitrate added.   TTE 06/07/2021: EF 55-60%.  Normal LV function with no R WMA.  GR 1 DD.  Aortic valve sclerosis but no stenosis.  Normal RVP.-Normal echo.  Component Ref Range & Units 11 d ago  (06/07/21) 11 d ago  (06/07/21) 11 d ago  (06/07/21) 11 d ago  (06/07/21) 2 wk ago  (06/03/21)  Troponin I (High Sensitivity) <18 ng/L 4,249 High Panic   3,905 High Panic  CM  3,851 High Panic  CM  4,438 High Panic  CM  163 High Panic     Component Ref Range & Units 2 wk ago   Lipoprotein (  a) <75.0 nmol/L 152.5 High     She is slowly steadily improving she is walking at home and is going to start cardiac rehab next week She has a little bit of exertional shortness of breath but no angina has not needed nitroglycerin She has developed a mild amount of edema but no orthopnea. Past Medical History:  Diagnosis Date   Coronary artery disease    Coronary artery disease involving native coronary artery of native heart with unstable angina pectoris ([place]) 06/09/2021   Cardiac  Cath-PCI 06/03/2021: Mid RCA 100% occlusion - ostial to distal mid RCA PCI with 3 overlapping DES stents (distal 2.5 x 30, mid 3.0 x 18, ostial-proximal 3.5 x 22 Onyx Frontier DES) but unable to pass stent to the distal 70% stenosis (very angulated).  Also noted ostial LCx 70 to 80% and OM1 mid 70% (recommended medical therapy).. Cardiac Cath 06/07/2021: mid-distal LM 30%-< Ost LCx ~80% (s   Hyperlipidemia    Hypertension    Presence of drug coated stent in right coronary artery 06/09/2021   Cardiac Cath-PCI 06/03/2021: (Inferior STEMI) mid RCA 100% occlusion - ostial to distal mid RCA PCI with 3 overlapping DES stents (distal 2.5 x 30, mid 3.0 x 18, ostial-proximal 3.5 x 22 Onyx Frontier DES) but unable to pass stent to the distal 70% stenosis (very angulated).    Past Surgical History:  Procedure Laterality Date   CORONARY/GRAFT ACUTE MI REVASCULARIZATION N/A 06/03/2021   Procedure: Coronary/Graft Acute MI Revascularization;  Surgeon: Belva Crome, MD;  Location: [facility];  Service: Cardiovascular;  Laterality: N/A;   CORONARY/GRAFT ACUTE MI REVASCULARIZATION N/A 06/07/2021   Procedure: Coronary/Graft Acute MI Revascularization;  Surgeon: Martinique, Peter M, MD;  Location: [facility];  Service: Cardiovascular;  Laterality: N/A;   LEFT HEART CATH AND CORONARY ANGIOGRAPHY N/A 06/03/2021   Procedure: LEFT HEART CATH AND CORONARY ANGIOGRAPHY;  Surgeon: Belva Crome, MD;  Location: [facility];  Service: Cardiovascular;  Laterality: N/A;   LEFT HEART CATH AND CORONARY ANGIOGRAPHY N/A 06/07/2021   Procedure: LEFT HEART CATH AND CORONARY ANGIOGRAPHY;  Surgeon: Martinique, Peter M, MD;  Location: [facility];  Service: Cardiovascular;  Laterality: N/A;    Current Medications: Current Meds  Medication Sig   aspirin 81 MG chewable tablet Chew 1 tablet (81 mg total) by mouth daily.   atorvastatin (LIPITOR) 80 MG tablet Take 1 tablet (80 mg total) by mouth daily.    isosorbide mononitrate (IMDUR) 30 MG 24 hr tablet Take 1 tablet (30 mg total) by mouth daily.   losartan (COZAAR) 50 MG tablet Take 1 tablet (50 mg total) by mouth daily.   metoprolol succinate (TOPROL-XL) 100 MG 24 hr tablet Take 1 tablet (100 mg total) by mouth daily. Take with or immediately following a meal.   nitroGLYCERIN (NITROSTAT) 0.4 MG SL tablet Place 1 tablet (0.4 mg total) under the tongue every 5 (five) minutes as needed for chest pain.   pantoprazole (PROTONIX) 40 MG tablet Take 1 tablet (40 mg total) by mouth daily.   ticagrelor (BRILINTA) 90 MG TABS tablet Take 1 tablet (90 mg total) by mouth 2 (two) times daily.     Allergies:   Patient has no known allergies.   Social History   Socioeconomic History   Marital status: Soil scientist    Spouse name: Not on file   Number of children: Not on file   Years of education: Not on file   Highest education  level: Not on file  Occupational History   Not on file  Tobacco Use   Smoking status: Every Day    Packs/day: 1.00    Years: 15.00    Pack years: 15.00    Types: Cigarettes   Smokeless tobacco: Never  Substance and Sexual Activity   Alcohol use: Never   Drug use: Yes    Types: Marijuana   Sexual activity: Yes  Other Topics Concern   Not on file  Social History Narrative   Not on file   Social Determinants of Health   Financial Resource Strain: Not on file  Food Insecurity: Not on file  Transportation Needs: Not on file  Physical Activity: Not on file  Stress: Not on file  Social Connections: Not on file     Family History: The patient's family history includes Heart disease in her mother.  And father  ROS:   ROS Please see the history of present illness.     All other systems reviewed and are negative.  EKGs/Labs/Other Studies Reviewed:    The following studies were reviewed today:   Recent Labs: 06/07/2021: ALT 38 06/10/2021: BUN 18; Creatinine, Ser 0.94; Hemoglobin 11.2; Platelets 408;  Potassium 3.2; Sodium 135  Recent Lipid Panel    Component Value Date/Time   CHOL 247 (H) 06/03/2021 1623   TRIG 49 06/03/2021 1623   HDL 79 06/03/2021 1623   CHOLHDL 3.1 06/03/2021 1623   VLDL 10 06/03/2021 1623   LDLCALC 158 (H) 06/03/2021 1623    Physical Exam:    VS:  BP 109/63    Pulse 70    Ht 4\' 10"  (1.473 m)    Wt 82 lb 0.3 oz (37.2 kg)    SpO2 99%    BMI 17.14 kg/m     Wt Readings from Last 3 Encounters:  06/18/21 82 lb 0.3 oz (37.2 kg)  06/08/21 73 lb 14.4 oz (33.5 kg)  06/04/21 73 lb 1.6 oz (33.2 kg)     GEN:  Well nourished, well developed in no acute distress HEENT: Normal NECK: No JVD; No carotid bruits LYMPHATICS: No lymphadenopathy CARDIAC: RRR, no murmurs, rubs, gallops RESPIRATORY:  Clear to auscultation without rales, wheezing or rhonchi  ABDOMEN: Soft, non-tender, non-distended MUSCULOSKELETAL: 1+ bilateral lower extremity edema; No deformity  SKIN: Warm and dry NEUROLOGIC:  Alert and oriented x 3 PSYCHIATRIC:  Normal affect     Signed, Shirlee More, MD  06/18/2021 1:43 PM    [facility]

## 2021-06-17 NOTE — Telephone Encounter (Signed)
Transitions of Care Pharmacy   Call attempted for a pharmacy transitions of care follow-up. HIPAA appropriate voicemail was left with call back information provided.   Call attempt #2. Will follow-up in 1-3 days.

## 2021-06-18 ENCOUNTER — Other Ambulatory Visit: Payer: Self-pay

## 2021-06-18 ENCOUNTER — Encounter: Payer: Self-pay | Admitting: Cardiology

## 2021-06-18 ENCOUNTER — Ambulatory Visit (INDEPENDENT_AMBULATORY_CARE_PROVIDER_SITE_OTHER): Payer: BLUE CROSS/BLUE SHIELD | Admitting: Cardiology

## 2021-06-18 VITALS — BP 109/63 | HR 70 | Ht <= 58 in | Wt 82.0 lb

## 2021-06-18 DIAGNOSIS — E785 Hyperlipidemia, unspecified: Secondary | ICD-10-CM | POA: Diagnosis not present

## 2021-06-18 DIAGNOSIS — I10 Essential (primary) hypertension: Secondary | ICD-10-CM

## 2021-06-18 DIAGNOSIS — E7841 Elevated Lipoprotein(a): Secondary | ICD-10-CM | POA: Diagnosis not present

## 2021-06-18 DIAGNOSIS — I25118 Atherosclerotic heart disease of native coronary artery with other forms of angina pectoris: Secondary | ICD-10-CM

## 2021-06-18 NOTE — Patient Instructions (Signed)
Medication Instructions:  Your physician recommends that you continue on your current medications as directed. Please refer to the Current Medication list given to you today.  *If you need a refill on your cardiac medications before your next appointment, please call your pharmacy*   Lab Work: Your physician recommends that you return for lab work in: 07/08/2021 CMP, LIPIDS, LPA If you have labs (blood work) drawn today and your tests are completely normal, you will receive your results only by: [place] (if you have MyChart) OR A paper copy in the mail If you have any lab test that is abnormal or we need to change your treatment, we will call you to review the results.   Testing/Procedures: NONE   Follow-Up: At [facility], you and your health needs are our priority.  As part of our continuing mission to provide you with exceptional heart care, we have created designated Provider Care Teams.  These Care Teams include your primary Cardiologist (physician) and Advanced Practice Providers (APPs -  Physician Assistants and Nurse Practitioners) who all work together to provide you with the care you need, when you need it.  We recommend signing up for the patient portal called "MyChart".  Sign up information is provided on this After Visit Summary.  MyChart is used to connect with patients for Virtual Visits (Telemedicine).  Patients are able to view lab/test results, encounter notes, upcoming appointments, etc.  Non-urgent messages can be sent to your provider as well.   To learn more about what you can do with MyChart, go to [email or web address].    Your next appointment:   6 week(s)  The format for your next appointment:   In Person  Provider:   Shirlee More, MD    Other Instructions

## 2021-06-21 ENCOUNTER — Other Ambulatory Visit (HOSPITAL_COMMUNITY): Payer: Self-pay

## 2021-06-21 ENCOUNTER — Telehealth (HOSPITAL_COMMUNITY): Payer: Self-pay | Admitting: Pharmacist

## 2021-06-21 ENCOUNTER — Ambulatory Visit (INDEPENDENT_AMBULATORY_CARE_PROVIDER_SITE_OTHER): Payer: BLUE CROSS/BLUE SHIELD | Admitting: Internal Medicine

## 2021-06-21 ENCOUNTER — Encounter: Payer: Self-pay | Admitting: Internal Medicine

## 2021-06-21 ENCOUNTER — Other Ambulatory Visit: Payer: BLUE CROSS/BLUE SHIELD

## 2021-06-21 VITALS — BP 102/70 | HR 77 | Ht <= 58 in | Wt 80.4 lb

## 2021-06-21 DIAGNOSIS — R066 Hiccough: Secondary | ICD-10-CM

## 2021-06-21 DIAGNOSIS — K219 Gastro-esophageal reflux disease without esophagitis: Secondary | ICD-10-CM | POA: Diagnosis not present

## 2021-06-21 DIAGNOSIS — C189 Malignant neoplasm of colon, unspecified: Secondary | ICD-10-CM | POA: Diagnosis not present

## 2021-06-21 DIAGNOSIS — Z1211 Encounter for screening for malignant neoplasm of colon: Secondary | ICD-10-CM

## 2021-06-21 DIAGNOSIS — R1310 Dysphagia, unspecified: Secondary | ICD-10-CM

## 2021-06-21 MED ORDER — PANTOPRAZOLE SODIUM 40 MG PO TBEC: 40.0000 mg | DELAYED_RELEASE_TABLET | Freq: Two times a day (BID) | ORAL | 3 refills | Status: DC

## 2021-06-21 NOTE — Telephone Encounter (Signed)
Transitions of Care Pharmacy   Call attempted for a pharmacy transitions of care follow-up. HIPAA appropriate voicemail was left with call back information provided.   Call attempt #3. No further follow-up at this time.

## 2021-06-21 NOTE — Patient Instructions (Addendum)
You have been scheduled for a Barium Esophogram at [facility] (1st floor of the hospital) on 06/28/2021 at 10:30am. Please arrive 15 minutes prior to your appointment for registration. Make certain not to have anything to eat or drink 3 hours prior to your test. If you need to reschedule for any reason, please contact radiology at [phone] to do so. __________________________________________________________________ A barium swallow is an examination that concentrates on views of the esophagus. This tends to be a double contrast exam (barium and two liquids which, when combined, create a gas to distend the wall of the oesophagus) or single contrast (non-ionic iodine based). The study is usually tailored to your symptoms so a good history is essential. Attention is paid during the study to the form, structure and configuration of the esophagus, looking for functional disorders (such as aspiration, dysphagia, achalasia, motility and reflux) EXAMINATION You may be asked to change into a gown, depending on the type of swallow being performed. A radiologist and radiographer will perform the procedure. The radiologist will advise you of the type of contrast selected for your procedure and direct you during the exam. You will be asked to stand, sit or lie in several different positions and to hold a small amount of fluid in your mouth before being asked to swallow while the imaging is performed .In some instances you may be asked to swallow barium coated marshmallows to assess the motility of a solid food bolus. The exam can be recorded as a digital or video fluoroscopy procedure. POST PROCEDURE It will take 1-2 days for the barium to pass through your system. To facilitate this, it is important, unless otherwise directed, to increase your fluids for the next 24-48hrs and to resume your normal diet.  This test typically takes about 30 minutes to perform.   Increase Pantoprazole to twice a  day   Your provider has requested that you go to the basement level for lab work before leaving today. Press "B" on the elevator. The lab is located at the first door on the left as you exit the elevator.   Due to recent changes in healthcare laws, you may see the results of your imaging and laboratory studies on MyChart before your provider has had a chance to review them.  We understand that in some cases there may be results that are confusing or concerning to you. Not all laboratory results come back in the same time frame and the provider may be waiting for multiple results in order to interpret others.  Please give [place] 48 hours in order for your provider to thoroughly review all the results before contacting the office for clarification of your results.    If you are age 64 or older, your body mass index should be between 23-30. Your Body mass index is 16.8 kg/m. If this is out of the aforementioned range listed, please consider follow up with your Primary Care Provider.  If you are age 33 or younger, your body mass index should be between 19-25. Your Body mass index is 16.8 kg/m. If this is out of the aformentioned range listed, please consider follow up with your Primary Care Provider.   ________________________________________________________  The [facility] providers would like to encourage you to use [facility] to communicate with providers for non-urgent requests or questions.  Due to long hold times on the telephone, sending your provider a message by [facility] may be a faster and more efficient way to get a response.  Please allow  48 business hours for a response.  Please remember that this is for non-urgent requests.  _______________________________________________________   Thank you for choosing [facility]  Sonny Masters Dorsey,MD    __________________________________________________________________________________

## 2021-06-21 NOTE — Progress Notes (Signed)
Chief Complaint: Dysphagia, hiccups, GERD, ab pain  HPI : 61 year old female with history of CAD s/p PCI, HTN, and HLD presents with dysphagia, hiccups, GERD, and ab pain  She has had dysphagia for years. The dysphagia has been worsening over time. The dysphagia occurs to solids and liquids. Sometimes food will get stuck in her upper chest, other times in her lower chest. She has not really adjusted her diet due to her issues with swallowing. She has also been having issues with hiccups. Since she has been on Protonix QD, she feels like her dysphagia and hiccups have been under been better control. When she swallows, she will have hiccups that will cause her to have pain in her chest. She eats 6-8 small meals per day. Endorses nauseous. Denies vomiting. Endorses chest burning and regurgitation, which has been on issue for a long time. She will have chest discomfort with difficulty swallowing. Denies prior EGD. She recently had a heart attack in 05/2021 when she had a heart stent placed in. Her mother had swallowing issues. Denies fam hx of GI cancers. She has alternating constipation and diarrhea. Patient states that she will occasionally feel a hard sensation in her upper abdomen.  Wt Readings from Last 3 Encounters:  06/21/21 80 lb 6.4 oz (36.5 kg)  06/18/21 82 lb 0.3 oz (37.2 kg)  06/08/21 73 lb 14.4 oz (33.5 kg)    Past Medical History:  Diagnosis Date   Coronary artery disease    Coronary artery disease involving native coronary artery of native heart with unstable angina pectoris ([place]) 06/09/2021   Cardiac Cath-PCI 06/03/2021: Mid RCA 100% occlusion - ostial to distal mid RCA PCI with 3 overlapping DES stents (distal 2.5 x 30, mid 3.0 x 18, ostial-proximal 3.5 x 22 Onyx Frontier DES) but unable to pass stent to the distal 70% stenosis (very angulated).  Also noted ostial LCx 70 to 80% and OM1 mid 70% (recommended medical therapy).. Cardiac Cath 06/07/2021: mid-distal LM 30%-< Ost LCx ~80%  (s   Hyperlipidemia    Hypertension    Presence of drug coated stent in right coronary artery 06/09/2021   Cardiac Cath-PCI 06/03/2021: (Inferior STEMI) mid RCA 100% occlusion - ostial to distal mid RCA PCI with 3 overlapping DES stents (distal 2.5 x 30, mid 3.0 x 18, ostial-proximal 3.5 x 22 Onyx Frontier DES) but unable to pass stent to the distal 70% stenosis (very angulated).     Past Surgical History:  Procedure Laterality Date   CORONARY/GRAFT ACUTE MI REVASCULARIZATION N/A 06/03/2021   Procedure: Coronary/Graft Acute MI Revascularization;  Surgeon: Belva Crome, MD;  Location: [facility];  Service: Cardiovascular;  Laterality: N/A;   CORONARY/GRAFT ACUTE MI REVASCULARIZATION N/A 06/07/2021   Procedure: Coronary/Graft Acute MI Revascularization;  Surgeon: Martinique, Peter M, MD;  Location: [facility];  Service: Cardiovascular;  Laterality: N/A;   LEFT HEART CATH AND CORONARY ANGIOGRAPHY N/A 06/03/2021   Procedure: LEFT HEART CATH AND CORONARY ANGIOGRAPHY;  Surgeon: Belva Crome, MD;  Location: [facility];  Service: Cardiovascular;  Laterality: N/A;   LEFT HEART CATH AND CORONARY ANGIOGRAPHY N/A 06/07/2021   Procedure: LEFT HEART CATH AND CORONARY ANGIOGRAPHY;  Surgeon: Martinique, Peter M, MD;  Location: [facility];  Service: Cardiovascular;  Laterality: N/A;   Family History  Problem Relation Age of Onset   Heart disease Mother    Social History   Tobacco Use   Smoking status: Former  Packs/day: 1.00    Years: 15.00    Pack years: 15.00    Types: Cigarettes    Quit date: 06/03/2021    Years since quitting: 0.0   Smokeless tobacco: Never  Substance Use Topics   Alcohol use: Never   Drug use: Yes    Types: Marijuana   Current Outpatient Medications  Medication Sig Dispense Refill   aspirin 81 MG chewable tablet Chew 1 tablet (81 mg total) by mouth daily.     atorvastatin (LIPITOR) 80 MG tablet Take 1 tablet (80 mg total) by mouth daily. 30  tablet 12   isosorbide mononitrate (IMDUR) 30 MG 24 hr tablet Take 1 tablet (30 mg total) by mouth daily. 30 tablet 11   losartan (COZAAR) 50 MG tablet Take 1 tablet (50 mg total) by mouth daily. 90 tablet 1   metoprolol succinate (TOPROL-XL) 100 MG 24 hr tablet Take 1 tablet (100 mg total) by mouth daily. Take with or immediately following a meal. 90 tablet 3   nitroGLYCERIN (NITROSTAT) 0.4 MG SL tablet Place 1 tablet (0.4 mg total) under the tongue every 5 (five) minutes as needed for chest pain. 25 tablet 3   pantoprazole (PROTONIX) 40 MG tablet Take 1 tablet (40 mg total) by mouth daily. 30 tablet 2   ticagrelor (BRILINTA) 90 MG TABS tablet Take 1 tablet (90 mg total) by mouth 2 (two) times daily. 60 tablet 12   No current facility-administered medications for this visit.   No Known Allergies   Review of Systems: All systems reviewed and negative except where noted in HPI.   Physical Exam: BP 102/70    Pulse 77    Ht 4\' 10"  (1.473 m)    Wt 80 lb 6.4 oz (36.5 kg)    BMI 16.80 kg/m  Constitutional: Pleasant,well-developed, female in no acute distress. HEENT: Normocephalic and atraumatic. Conjunctivae are normal. No scleral icterus. Cardiovascular: Normal rate, regular rhythm.  Pulmonary/chest: Effort normal and breath sounds normal. No wheezing, rales or rhonchi. Abdominal: Soft, nondistended, nontender. Bowel sounds active throughout. There are no masses palpable. No hepatomegaly. Extremities: No edema Neurological: Alert and oriented to person place and time. Skin: Skin is warm and dry. No rashes noted. Psychiatric: Normal mood and affect. Behavior is normal.  Labs 05/2021: CBC with mild anemia of 11.2. BMP with low potassium of 3.2.  DG Chest 2 View  Result Date: 06/09/2021 CLINICAL DATA:  Right-sided rhonchi on exam EXAM: CHEST - 2 VIEW COMPARISON:  06/07/2021 FINDINGS: The heart size and mediastinal contours are within normal limits. Both lungs are clear. Probable chronic  fracture deformity at the left humeral neck. IMPRESSION: No active cardiopulmonary disease. Electronically Signed   By: Donavan Foil M.D.   On: 06/09/2021 15:26   CARDIAC CATHETERIZATION  Result Date: 06/07/2021   Mid LM to Dist LM lesion is 30% stenosed.   Ost LAD to Prox LAD lesion is 40% stenosed.   Prox LAD to Mid LAD lesion is 30% stenosed.   Mid Cx lesion is 65% stenosed.   Prox RCA to Mid RCA lesion is 30% stenosed.   Mid RCA lesion is 70% stenosed.   Dist RCA lesion is 50% stenosed.   1st Mrg lesion is 70% stenosed.   Ost Cx lesion is 80% stenosed.   Non-stenotic Ost RCA lesion was previously treated.   LV end diastolic pressure is normal. Continued patency of the stents in the RCA. There is mild tissue prolapse in the mid stent but this is  not flow limiting. There is no change in the 70% stenosis distal to the stent in a very tortuous segment. The patient does have moderate to severe disease at the ostium of the LCx that may be the source of her symptoms. This is not amenable to PCI given trifurcation. Low LVEDP Plan: optimize medical therapy. Increase Toprol XL dose and add oral nitrates. Check Echo.   CARDIAC CATHETERIZATION  Result Date: 06/03/2021   A stent was successfully placed.   Post intervention, there is a 50% residual stenosis.   Post intervention, there is a 70% residual stenosis. Acute inferior ST elevation MI due to total mid vessel occlusion Successful complicated procedure requiring guide catheter extension, double wire, and overlapping stenting from distal vessel to the ostium of RCA.  3 stents used.  Residual stenosis of approximately 30% in the mid vessel due to prolapse of tissue at site of stent overlap.  The ostial RCA stent overhangs into the aorta significantly.  Not likely to be able to selectively engage in the future.  Beyond the stented segment there is a angulated 50 to 70% stenosis. Ostial 70 to 80% circumflex.  First obtuse marginal 70% mid.  Mid circumflex 50%.  Distal left main 20 to 30%. Ostial LAD 20 to 30%.  30% mid. Moderate inferobasal hypokinesis with EF 40%. RECOMMENDATIONS: Aggressive risk factor modification including smoking cessation, and encouraging the patient to use medications for control of blood pressure and cholesterol. High risk for recurrent acute ischemic events due to phenotype and conventional medical therapy refusal   DG CHEST PORT 1 VIEW  Result Date: 06/07/2021 CLINICAL DATA:  Chest pressure EXAM: PORTABLE CHEST 1 VIEW COMPARISON:  06/03/2021 FINDINGS: Lungs are clear.  No pleural effusion or pneumothorax. The heart is normal in size. IMPRESSION: No evidence of acute cardiopulmonary disease. Electronically Signed   By: Julian Hy M.D.   On: 06/07/2021 07:22   DG CHEST PORT 1 VIEW  Result Date: 06/03/2021 CLINICAL DATA:  Shortness of breath EXAM: PORTABLE CHEST 1 VIEW COMPARISON:  None. FINDINGS: The heart size and mediastinal contours are within normal limits. Both lungs are clear. The visualized skeletal structures are unremarkable. Bilateral nipple shadows are noted. IMPRESSION: No active disease. Electronically Signed   By: Inez Catalina M.D.   On: 06/03/2021 19:15   ECHOCARDIOGRAM COMPLETE  Result Date: 06/07/2021    ECHOCARDIOGRAM REPORT   Patient Name:   Carrie Butler [facility] Date of Exam: 06/07/2021 Medical Rec #:  [id number]         Height:       58.0 in Accession #:    [id number]        Weight:       73.2 lb Date of Birth:  10-05-1960          BSA:          1.188 m Patient Age:    57 years          BP:           145/96 mmHg Patient Gender: F                 HR:           89 bpm. Exam Location:  Inpatient Procedure: 2D Echo, Cardiac Doppler and Color Doppler Indications:    Chest Pain  History:        Patient has no prior history of Echocardiogram examinations.  Previous Myocardial Infarction; Risk Factors:Hypertension and                 Dyslipidemia. Tobacco use, Stents x3.  Sonographer:    Wenda Low  Referring Phys: [phone] HAO MENG IMPRESSIONS  1. Left ventricular ejection fraction, by estimation, is 55 to 60%. The left ventricle has normal function. The left ventricle has no regional wall motion abnormalities. There is moderate left ventricular hypertrophy. Left ventricular diastolic parameters are consistent with Grade I diastolic dysfunction (impaired relaxation).  2. Right ventricular systolic function is normal. The right ventricular size is normal.  3. The mitral valve is normal in structure. No evidence of mitral valve regurgitation. No evidence of mitral stenosis.  4. The aortic valve is tricuspid. Aortic valve regurgitation is not visualized. Aortic valve sclerosis is present, with no evidence of aortic valve stenosis.  5. The inferior vena cava is normal in size with greater than 50% respiratory variability, suggesting right atrial pressure of 3 mmHg. Comparison(s): No prior Echocardiogram. FINDINGS  Left Ventricle: Left ventricular ejection fraction, by estimation, is 55 to 60%. The left ventricle has normal function. The left ventricle has no regional wall motion abnormalities. The left ventricular internal cavity size was normal in size. There is  moderate left ventricular hypertrophy. Left ventricular diastolic parameters are consistent with Grade I diastolic dysfunction (impaired relaxation). Right Ventricle: The right ventricular size is normal. Right ventricular systolic function is normal. Left Atrium: Left atrial size was normal in size. Right Atrium: Right atrial size was normal in size. Pericardium: There is no evidence of pericardial effusion. Mitral Valve: The mitral valve is normal in structure. Mild mitral annular calcification. No evidence of mitral valve regurgitation. No evidence of mitral valve stenosis. MV peak gradient, 2.5 mmHg. The mean mitral valve gradient is 1.0 mmHg. Tricuspid Valve: The tricuspid valve is normal in structure. Tricuspid valve regurgitation is trivial. No  evidence of tricuspid stenosis. Aortic Valve: The aortic valve is tricuspid. Aortic valve regurgitation is not visualized. Aortic valve sclerosis is present, with no evidence of aortic valve stenosis. Aortic valve mean gradient measures 3.0 mmHg. Aortic valve peak gradient measures 5.3  mmHg. Aortic valve area, by VTI measures 1.83 cm. Pulmonic Valve: The pulmonic valve was not well visualized. Pulmonic valve regurgitation is not visualized. No evidence of pulmonic stenosis. Aorta: The aortic root is normal in size and structure. Venous: The inferior vena cava is normal in size with greater than 50% respiratory variability, suggesting right atrial pressure of 3 mmHg. IAS/Shunts: No atrial level shunt detected by color flow Doppler.  LEFT VENTRICLE PLAX 2D LVIDd:         2.90 cm     Diastology LVIDs:         2.00 cm     LV e' medial:    4.56 cm/s LV PW:         1.00 cm     LV E/e' medial:  13.9 LV IVS:        1.00 cm     LV e' lateral:   6.66 cm/s LVOT diam:     1.80 cm     LV E/e' lateral: 9.5 LV SV:         38 LV SV Index:   32 LVOT Area:     2.54 cm  LV Volumes (MOD) LV vol d, MOD A2C: 20.5 ml LV vol d, MOD A4C: 22.2 ml LV vol s, MOD A2C: 9.7 ml LV vol s, MOD A4C: 11.1 ml  LV SV MOD A2C:     10.8 ml LV SV MOD A4C:     22.2 ml LV SV MOD BP:      11.4 ml LEFT ATRIUM           Index        RIGHT ATRIUM          Index LA diam:      1.80 cm 1.52 cm/m   RA Area:     5.56 cm LA Vol (A4C): 12.9 ml 10.86 ml/m  RA Volume:   7.23 ml  6.09 ml/m  AORTIC VALVE                    PULMONIC VALVE AV Area (Vmax):    2.23 cm     PV Vmax:       1.19 m/s AV Area (Vmean):   1.85 cm     PV Peak grad:  5.7 mmHg AV Area (VTI):     1.83 cm AV Vmax:           115.00 cm/s AV Vmean:          81.000 cm/s AV VTI:            0.207 m AV Peak Grad:      5.3 mmHg AV Mean Grad:      3.0 mmHg LVOT Vmax:         101.00 cm/s LVOT Vmean:        59.000 cm/s LVOT VTI:          0.149 m LVOT/AV VTI ratio: 0.72  AORTA Ao Root diam: 2.80 cm MITRAL  VALVE MV Area (PHT): 2.38 cm    SHUNTS MV Area VTI:   1.67 cm    Systemic VTI:  0.15 m MV Peak grad:  2.5 mmHg    Systemic Diam: 1.80 cm MV Mean grad:  1.0 mmHg MV Vmax:       0.79 m/s MV Vmean:      43.7 cm/s MV Decel Time: 319 msec MV E velocity: 63.40 cm/s MV A velocity: 84.90 cm/s MV E/A ratio:  0.75 Kirk Ruths MD Electronically signed by Kirk Ruths MD Signature Date/Time: 06/07/2021/3:45:10 PM    Final    VAS US CAROTID  Result Date: 06/06/2021 Carotid Arterial Duplex Study Patient Name:  Carrie Butler [facility]  Date of Exam:   06/05/2021 Medical Rec #: [id number]          Accession #:    [id number] Date of Birth: 1960/09/23           Patient Gender: F Patient Age:   13 years Exam Location:  [facility] Procedure:      VAS US CAROTID Referring Phys: Ria Comment ROBERTS --------------------------------------------------------------------------------  Indications:       Left bruit. Risk Factors:      Hypertension, hyperlipidemia, current smoker. Comparison Study:  No prior studies. Performing Technologist: Oliver Hum RVT  Examination Guidelines: A complete evaluation includes B-mode imaging, spectral Doppler, color Doppler, and power Doppler as needed of all accessible portions of each vessel. Bilateral testing is considered an integral part of a complete examination. Limited examinations for reoccurring indications may be performed as noted.  Right Carotid Findings: +----------+--------+--------+--------+-----------------------+--------+             PSV cm/s EDV cm/s Stenosis Plaque Description      Comments  +----------+--------+--------+--------+-----------------------+--------+  CCA Prox   101      19  smooth and heterogenous           +----------+--------+--------+--------+-----------------------+--------+  CCA Distal 81       25                smooth and heterogenous           +----------+--------+--------+--------+-----------------------+--------+  ICA Prox   138      32                 calcific                          +----------+--------+--------+--------+-----------------------+--------+  ICA Distal 66       23                                                  +----------+--------+--------+--------+-----------------------+--------+  ECA        163      20                                                  +----------+--------+--------+--------+-----------------------+--------+ +----------+--------+-------+--------+-------------------+             PSV cm/s EDV cms Describe Arm Pressure (mmHG)  +----------+--------+-------+--------+-------------------+  Subclavian 143                                            +----------+--------+-------+--------+-------------------+ +---------+--------+--+--------+--+---------+  Vertebral PSV cm/s 26 EDV cm/s 11 Antegrade  +---------+--------+--+--------+--+---------+  Left Carotid Findings: +----------+--------+--------+--------+-----------------------+--------+             PSV cm/s EDV cm/s Stenosis Plaque Description      Comments  +----------+--------+--------+--------+-----------------------+--------+  CCA Prox   72       19                smooth and heterogenous           +----------+--------+--------+--------+-----------------------+--------+  CCA Distal 67       31                smooth and heterogenous           +----------+--------+--------+--------+-----------------------+--------+  ICA Prox   139      66                calcific                          +----------+--------+--------+--------+-----------------------+--------+  ICA Mid    257      124               calcific                          +----------+--------+--------+--------+-----------------------+--------+  ICA Distal 74       31                                                  +----------+--------+--------+--------+-----------------------+--------+  ECA        325      45                                                   +----------+--------+--------+--------+-----------------------+--------+ +----------+--------+--------+--------+-------------------+             PSV cm/s EDV cm/s Describe Arm Pressure (mmHG)  +----------+--------+--------+--------+-------------------+  Subclavian 168                                             +----------+--------+--------+--------+-------------------+ +---------+--------+--+--------+--+---------+  Vertebral PSV cm/s 17 EDV cm/s 10 Antegrade  +---------+--------+--+--------+--+---------+   Summary: Right Carotid: Velocities in the right ICA are consistent with a 1-39% stenosis. Left Carotid: Velocities in the left ICA are consistent with a 80-99% stenosis. Vertebrals: Bilateral vertebral arteries demonstrate antegrade flow. *See table(s) above for measurements and observations.  Electronically signed by Jamelle Haring on 06/06/2021 at 3:28:40 PM.    Final      ASSESSMENT AND PLAN:  Dysphagia Hiccups GERD Colon cancer screening Patient presents with longstanding issues with dysphagia, hiccups, and GERD.  Patient does need an EGD for further evaluation, but she recently had a heart attack in 05/2021 so will need to hold off on any GI procedures for 6 months. She has still been able to tolerate PO in the meantime. Will plan to start off with a barium swallow for further evaluation of her dysphagia. Patient may ultimately benefit from esophageal manometry but would want to perform an EGD first. Patient has never had a colonoscopy in the past but is not interested in colonoscopy for colon cancer screening at this time. Will have her increase her PPI from QD to BID to see if this will help with her GI symptoms at this time. Her hiccups could be related to uncontrolled GERD. - Increase PPI from QD to BID - Barium swallow - FIT for colon cancer screening - RTC in 5 months. Will plan for EGD at that time, will need to check with her cardiologist about holding Brilinta at that time  Christia Reading, MD

## 2021-06-28 ENCOUNTER — Ambulatory Visit (HOSPITAL_COMMUNITY): Payer: BLUE CROSS/BLUE SHIELD

## 2021-07-06 ENCOUNTER — Other Ambulatory Visit (HOSPITAL_COMMUNITY): Payer: Self-pay

## 2021-07-30 ENCOUNTER — Encounter: Payer: Self-pay | Admitting: Cardiology

## 2021-07-30 ENCOUNTER — Other Ambulatory Visit: Payer: Self-pay

## 2021-07-30 ENCOUNTER — Ambulatory Visit (INDEPENDENT_AMBULATORY_CARE_PROVIDER_SITE_OTHER): Payer: BLUE CROSS/BLUE SHIELD | Admitting: Cardiology

## 2021-07-30 VITALS — BP 110/69 | HR 60 | Ht <= 58 in | Wt 86.0 lb

## 2021-07-30 DIAGNOSIS — E785 Hyperlipidemia, unspecified: Secondary | ICD-10-CM | POA: Diagnosis not present

## 2021-07-30 DIAGNOSIS — I10 Essential (primary) hypertension: Secondary | ICD-10-CM | POA: Diagnosis not present

## 2021-07-30 DIAGNOSIS — I25118 Atherosclerotic heart disease of native coronary artery with other forms of angina pectoris: Secondary | ICD-10-CM | POA: Diagnosis not present

## 2021-07-30 DIAGNOSIS — E7841 Elevated Lipoprotein(a): Secondary | ICD-10-CM

## 2021-07-30 NOTE — Progress Notes (Signed)
Cardiology Office Note:    Date:  07/30/2021   ID:  Carrie Butler, DOB 08/03/1960, MRN [id number]  PCP:  Pcp, No  Cardiologist:  Shirlee More, MD    Referring MD: No ref. provider found    ASSESSMENT:    1. Coronary artery disease involving native coronary artery of native heart with other form of angina pectoris ([place])   2. Hyperlipidemia LDL goal <70   3. Elevated lipoprotein(a)   4. Primary hypertension    PLAN:    In order of problems listed above:  She is slowly steadily improved from her very complex cardiac intervention 06/01/2021 is not having any angina continue her dual antiplatelet therapy uninterrupted for a year along with her lipid-lowering and reduced dose beta-blocker.  Low blood pressure we will discontinue her ARB and monitoring cardiac rehabilitation recheck her lipid profile goal LDL less than 50-55 as well as her LP(a) disease she is a candidate for the [place] study   Next appointment: 3 months   Medication Adjustments/Labs and Tests Ordered: Current medicines are reviewed at length with the patient today.  Concerns regarding medicines are outlined above.  Orders Placed This Encounter  Procedures   Comprehensive Metabolic Panel (CMET)   Lipid Profile   Lipoprotein A (LPA)   No orders of the defined types were placed in this encounter.  Follow-up for CAD doing much better and presently involved in cardiac rehabilitation   History of Present Illness:    Carrie Butler is a 61 y.o. female with a hx of CAD with recent ST elevation MI PCI with 3 drug-eluting stents to the right coronary artery 06/03/2021.  She had extensive residual disease distal to the multiple stents in the ostium of the left circumflex coronary artery not felt to be amenable to PCI present on follow-up coronary angiography 06/07/2021 felt to be best treated medically.  Other problems include hypertensive heart disease with moderate LVH normal ejection fraction hyperlipidemia  hypokalemia and tobacco usage.  She was last seen 06/18/2021.  Lipid profile LP(a) level were ordered not performed.  Compliance with diet, lifestyle and medications: Yes  Overall doing better participates in cardiac rehabilitation.  She has had orthostatic lightheadedness on her own reduced her beta-blocker dose.  Her initial blood pressure today was low and I am going to hold discontinue her ARB will monitor blood pressure and cardiac rehabilitation She has had some brief momentary sharp chest pain localized not anginal in nature has not needed nitroglycerin and is slowly and steadily improved no shortness of breath edema orthopnea or syncope.  She tolerates her statin without muscle pain or weakness and her dual antiplatelet therapy. Past Medical History:  Diagnosis Date   Coronary artery disease    Coronary artery disease involving native coronary artery of native heart with unstable angina pectoris ([place]) 06/09/2021   Cardiac Cath-PCI 06/03/2021: Mid RCA 100% occlusion - ostial to distal mid RCA PCI with 3 overlapping DES stents (distal 2.5 x 30, mid 3.0 x 18, ostial-proximal 3.5 x 22 Onyx Frontier DES) but unable to pass stent to the distal 70% stenosis (very angulated).  Also noted ostial LCx 70 to 80% and OM1 mid 70% (recommended medical therapy).. Cardiac Cath 06/07/2021: mid-distal LM 30%-< Ost LCx ~80% (s   Hyperlipidemia    Hypertension    Presence of drug coated stent in right coronary artery 06/09/2021   Cardiac Cath-PCI 06/03/2021: (Inferior STEMI) mid RCA 100% occlusion - ostial to distal mid RCA PCI with 3 overlapping DES  stents (distal 2.5 x 30, mid 3.0 x 18, ostial-proximal 3.5 x 22 Onyx Frontier DES) but unable to pass stent to the distal 70% stenosis (very angulated).    Past Surgical History:  Procedure Laterality Date   CORONARY/GRAFT ACUTE MI REVASCULARIZATION N/A 06/03/2021   Procedure: Coronary/Graft Acute MI Revascularization;  Surgeon: Belva Crome, MD;  Location: [facility];  Service: Cardiovascular;  Laterality: N/A;   CORONARY/GRAFT ACUTE MI REVASCULARIZATION N/A 06/07/2021   Procedure: Coronary/Graft Acute MI Revascularization;  Surgeon: Martinique, Peter M, MD;  Location: [facility];  Service: Cardiovascular;  Laterality: N/A;   LEFT HEART CATH AND CORONARY ANGIOGRAPHY N/A 06/03/2021   Procedure: LEFT HEART CATH AND CORONARY ANGIOGRAPHY;  Surgeon: Belva Crome, MD;  Location: [facility];  Service: Cardiovascular;  Laterality: N/A;   LEFT HEART CATH AND CORONARY ANGIOGRAPHY N/A 06/07/2021   Procedure: LEFT HEART CATH AND CORONARY ANGIOGRAPHY;  Surgeon: Martinique, Peter M, MD;  Location: [facility];  Service: Cardiovascular;  Laterality: N/A;    Current Medications: Current Meds  Medication Sig   aspirin 81 MG chewable tablet Chew 1 tablet (81 mg total) by mouth daily.   atorvastatin (LIPITOR) 80 MG tablet Take 1 tablet (80 mg total) by mouth daily.   isosorbide mononitrate (IMDUR) 30 MG 24 hr tablet Take 1 tablet (30 mg total) by mouth daily.   metoprolol succinate (TOPROL-XL) 50 MG 24 hr tablet Take 50 mg by mouth daily. Take with or immediately following a meal.   nitroGLYCERIN (NITROSTAT) 0.4 MG SL tablet Place 1 tablet (0.4 mg total) under the tongue every 5 (five) minutes as needed for chest pain.   pantoprazole (PROTONIX) 40 MG tablet Take 1 tablet (40 mg total) by mouth 2 (two) times daily.   ticagrelor (BRILINTA) 90 MG TABS tablet Take 1 tablet (90 mg total) by mouth 2 (two) times daily.   [DISCONTINUED] losartan (COZAAR) 50 MG tablet Take 1 tablet (50 mg total) by mouth daily.     Allergies:   Patient has no known allergies.   Social History   Socioeconomic History   Marital status: Soil scientist    Spouse name: Not on file   Number of children: Not on file   Years of education: Not on file   Highest education level: Not on file  Occupational History   Not on file  Tobacco Use   Smoking status: Former     Packs/day: 1.00    Years: 15.00    Pack years: 15.00    Types: Cigarettes    Quit date: 06/03/2021    Years since quitting: 0.1   Smokeless tobacco: Never  Substance and Sexual Activity   Alcohol use: Never   Drug use: Yes    Types: Marijuana   Sexual activity: Yes  Other Topics Concern   Not on file  Social History Narrative   Not on file   Social Determinants of Health   Financial Resource Strain: Not on file  Food Insecurity: Not on file  Transportation Needs: Not on file  Physical Activity: Not on file  Stress: Not on file  Social Connections: Not on file     Family History: The patient's family history includes Heart disease in her mother. ROS:   Please see the history of present illness.    All other systems reviewed and are negative.  EKGs/Labs/Other Studies Reviewed:    The following studies were reviewed today:    Component Ref Range &  Units 2 wk ago   Lipoprotein (a) <75.0 nmol/L 152.5 High     Recent Labs: 06/07/2021: ALT 38 06/10/2021: BUN 18; Creatinine, Ser 0.94; Hemoglobin 11.2; Platelets 408; Potassium 3.2; Sodium 135  Recent Lipid Panel    Component Value Date/Time   CHOL 247 (H) 06/03/2021 1623   TRIG 49 06/03/2021 1623   HDL 79 06/03/2021 1623   CHOLHDL 3.1 06/03/2021 1623   VLDL 10 06/03/2021 1623   LDLCALC 158 (H) 06/03/2021 1623    Physical Exam:    VS:  BP 110/69 (BP Location: Left Arm, Patient Position: Standing)    Pulse 60    Ht 4\' 10"  (1.473 m)    Wt 86 lb (39 kg)    SpO2 97%    BMI 17.97 kg/m     Wt Readings from Last 3 Encounters:  07/30/21 86 lb (39 kg)  06/21/21 80 lb 6.4 oz (36.5 kg)  06/18/21 82 lb 0.3 oz (37.2 kg)     GEN: Very small stature well nourished, well developed in no acute distress HEENT: Normal NECK: No JVD; No carotid bruits LYMPHATICS: No lymphadenopathy CARDIAC: RRR, no murmurs, rubs, gallops RESPIRATORY:  Clear to auscultation without rales, wheezing or rhonchi  ABDOMEN: Soft, non-tender,  non-distended MUSCULOSKELETAL:  No edema; No deformity  SKIN: Warm and dry NEUROLOGIC:  Alert and oriented x 3 PSYCHIATRIC:  Normal affect    Signed, Shirlee More, MD  07/30/2021 5:21 PM    [place]

## 2021-07-30 NOTE — Patient Instructions (Signed)
Medication Instructions:  Your physician has recommended you make the following change in your medication:   STOP: Losartan  *If you need a refill on your cardiac medications before your next appointment, please call your pharmacy*   Lab Work: Your physician recommends that you return for lab work in:   Labs today: CMP, Lipids, Lpa  If you have labs (blood work) drawn today and your tests are completely normal, you will receive your results only by: [place] (if you have [place]) OR A paper copy in the mail If you have any lab test that is abnormal or we need to change your treatment, we will call you to review the results.   Testing/Procedures: None   Follow-Up: At [facility], you and your health needs are our priority.  As part of our continuing mission to provide you with exceptional heart care, we have created designated Provider Care Teams.  These Care Teams include your primary Cardiologist (physician) and Advanced Practice Providers (APPs -  Physician Assistants and Nurse Practitioners) who all work together to provide you with the care you need, when you need it.  We recommend signing up for the patient portal called "MyChart".  Sign up information is provided on this After Visit Summary.  MyChart is used to connect with patients for Virtual Visits (Telemedicine).  Patients are able to view lab/test results, encounter notes, upcoming appointments, etc.  Non-urgent messages can be sent to your provider as well.   To learn more about what you can do with MyChart, go to [email or web address].    Your next appointment:   3 month(s)  The format for your next appointment:   In Person  Provider:   Shirlee More, MD    Other Instructions None

## 2021-07-31 LAB — COMPREHENSIVE METABOLIC PANEL
ALT: 86 IU/L — ABNORMAL HIGH (ref 0–32)
AST: 72 IU/L — ABNORMAL HIGH (ref 0–40)
Albumin/Globulin Ratio: 2.1 (ref 1.2–2.2)
Albumin: 4.1 g/dL (ref 3.8–4.9)
Alkaline Phosphatase: 130 IU/L — ABNORMAL HIGH (ref 44–121)
BUN/Creatinine Ratio: 18 (ref 9–23)
BUN: 17 mg/dL (ref 6–24)
Bilirubin Total: <0.2 mg/dL (ref 0.0–1.2)
CO2: 23 mmol/L (ref 20–29)
Calcium: 8.7 mg/dL (ref 8.7–10.2)
Chloride: 104 mmol/L (ref 96–106)
Creatinine, Ser: 0.94 mg/dL (ref 0.57–1.00)
Globulin, Total: 2 g/dL (ref 1.5–4.5)
Glucose: 107 mg/dL — ABNORMAL HIGH (ref 70–99)
Potassium: 4.1 mmol/L (ref 3.5–5.2)
Sodium: 143 mmol/L (ref 134–144)
Total Protein: 6.1 g/dL (ref 6.0–8.5)
eGFR: 70 mL/min/{1.73_m2} (ref >59)

## 2021-07-31 LAB — LIPID PANEL
Chol/HDL Ratio: 2.3 ratio (ref 0.0–4.4)
Cholesterol, Total: 132 mg/dL (ref 100–199)
HDL: 58 mg/dL (ref >39)
LDL Chol Calc (NIH): 49 mg/dL (ref 0–99)
Triglycerides: 147 mg/dL (ref 0–149)
VLDL Cholesterol Cal: 25 mg/dL (ref 5–40)

## 2021-07-31 LAB — LIPOPROTEIN A (LPA): Lipoprotein (a): 188.4 nmol/L — ABNORMAL HIGH (ref <75.0)

## 2021-08-02 ENCOUNTER — Telehealth: Payer: Self-pay

## 2021-08-02 NOTE — Telephone Encounter (Signed)
-----   Message from Richardo Priest, MD sent at 07/31/2021  6:05 PM EST ----- Regarding: FW: LDL is good but LPa severely elevated  CC to [place] Should enter [place] study as we discussed ----- Message ----- From: Interface, [facility] Lab Results In Sent: 07/31/2021   1:36 PM EST To: Richardo Priest, MD

## 2021-08-02 NOTE — Telephone Encounter (Signed)
Left message on patients voicemail to please return our call.   

## 2021-08-02 NOTE — Telephone Encounter (Signed)
Spoke with patient regarding results and recommendation.  Patient verbalizes understanding and is agreeable to plan of care. Advised patient to call back with any issues or concerns.  

## 2021-08-03 ENCOUNTER — Other Ambulatory Visit: Payer: Self-pay | Admitting: Student

## 2021-08-31 ENCOUNTER — Other Ambulatory Visit: Payer: Self-pay | Admitting: Cardiology

## 2021-09-07 ENCOUNTER — Other Ambulatory Visit (INDEPENDENT_AMBULATORY_CARE_PROVIDER_SITE_OTHER): Payer: Self-pay | Admitting: Nurse Practitioner

## 2021-09-07 DIAGNOSIS — I6522 Occlusion and stenosis of left carotid artery: Secondary | ICD-10-CM

## 2021-09-08 ENCOUNTER — Other Ambulatory Visit: Payer: Self-pay

## 2021-09-08 ENCOUNTER — Encounter (INDEPENDENT_AMBULATORY_CARE_PROVIDER_SITE_OTHER): Payer: Self-pay

## 2021-09-08 ENCOUNTER — Encounter (INDEPENDENT_AMBULATORY_CARE_PROVIDER_SITE_OTHER): Payer: Self-pay | Admitting: Nurse Practitioner

## 2021-09-08 ENCOUNTER — Ambulatory Visit (INDEPENDENT_AMBULATORY_CARE_PROVIDER_SITE_OTHER): Payer: BLUE CROSS/BLUE SHIELD

## 2021-09-08 ENCOUNTER — Ambulatory Visit (INDEPENDENT_AMBULATORY_CARE_PROVIDER_SITE_OTHER): Payer: BLUE CROSS/BLUE SHIELD | Admitting: Nurse Practitioner

## 2021-09-08 VITALS — BP 160/83 | HR 60 | Resp 16 | Ht <= 58 in | Wt 85.8 lb

## 2021-09-08 DIAGNOSIS — E785 Hyperlipidemia, unspecified: Secondary | ICD-10-CM | POA: Diagnosis not present

## 2021-09-08 DIAGNOSIS — I10 Essential (primary) hypertension: Secondary | ICD-10-CM | POA: Diagnosis not present

## 2021-09-08 DIAGNOSIS — I6522 Occlusion and stenosis of left carotid artery: Secondary | ICD-10-CM

## 2021-09-08 DIAGNOSIS — I739 Peripheral vascular disease, unspecified: Secondary | ICD-10-CM

## 2021-09-09 ENCOUNTER — Telehealth (INDEPENDENT_AMBULATORY_CARE_PROVIDER_SITE_OTHER): Payer: Self-pay | Admitting: Nurse Practitioner

## 2021-09-09 ENCOUNTER — Encounter (INDEPENDENT_AMBULATORY_CARE_PROVIDER_SITE_OTHER): Payer: Self-pay | Admitting: Nurse Practitioner

## 2021-09-09 NOTE — Telephone Encounter (Signed)
LVM with pt advising that I spoke with [employer] /[place] and [facility] is not in network and no benefits are allowed. Pt will have to pick an in network to have her CT performed. I advised that a PA can't be started until I know a facility and asked for a call back ASAP.  ?

## 2021-09-09 NOTE — Progress Notes (Signed)
? ?Subjective:  ? ? Patient ID: Carrie Butler, female    DOB: 04-01-1961, 61 y.o.   MRN: [id number] ?Chief Complaint  ?Patient presents with  ? Establish Care  ?  Ultrasound  ? ? ?Carrie Butler is a 61 year old female that presents today as a referral from [place], [place] in regards to carotid artery stenosis.  On 06/03/2021 the patient had a STEMI and then subsequently on 06/07/2021 the patient had an NSTEMI.  During this time the patient had a carotid duplex performed which showed a 1 to 39% stenosis of the right ICA with an 80 to 99% stenosis of the left ICA.  Currently the patient is in cardiac rehab and notes that she has significant pain and cramping in her lower extremities primarily in the calves with extensive activity.  Her biggest concern is her severe fatigue that also occurs with any level of activity.  Since her recent intervention she denies any chest pain.  Previously the patient does not have any TIA or strokelike symptoms. ? ? ?Review of Systems  ?Constitutional:  Positive for fatigue.  ?All other systems reviewed and are negative. ? ?   ?Objective:  ? Physical Exam ?Vitals reviewed.  ?HENT:  ?   Head: Normocephalic.  ?Cardiovascular:  ?   Rate and Rhythm: Normal rate.  ?   Pulses:     ?     Carotid pulses are  on the left side with bruit. ?     Dorsalis pedis pulses are 1+ on the right side and 1+ on the left side.  ?     Posterior tibial pulses are 1+ on the right side and 1+ on the left side.  ?Pulmonary:  ?   Effort: Pulmonary effort is normal.  ?Musculoskeletal:  ?   Right lower leg: No edema.  ?   Left lower leg: No edema.  ?Skin: ?   General: Skin is warm and dry.  ?Neurological:  ?   Mental Status: She is alert and oriented to person, place, and time.  ?Psychiatric:     ?   Mood and Affect: Mood normal.     ?   Behavior: Behavior normal.     ?   Thought Content: Thought content normal.     ?   Judgment: Judgment normal.  ? ? ?BP (!) 160/83 (BP Location: Right Arm)   Pulse 60   Resp 16   Ht  '4\' 10"'$  (1.473 m)   Wt 85 lb 12.8 oz (38.9 kg)   BMI 17.93 kg/m?  ? ?Past Medical History:  ?Diagnosis Date  ? Coronary artery disease   ? Coronary artery disease involving native coronary artery of native heart with unstable angina pectoris ([place]) 06/09/2021  ? Cardiac Cath-PCI 06/03/2021: Mid RCA 100% occlusion - ostial to distal mid RCA PCI with 3 overlapping DES stents (distal 2.5 x 30, mid 3.0 x 18, ostial-proximal 3.5 x 22 Onyx Frontier DES) but unable to pass stent to the distal 70% stenosis (very angulated).  Also noted ostial LCx 70 to 80% and OM1 mid 70% (recommended medical therapy).. Cardiac Cath 06/07/2021: mid-distal LM 30%-< Ost LCx ~80% (s  ? Hyperlipidemia   ? Hypertension   ? Presence of drug coated stent in right coronary artery 06/09/2021  ? Cardiac Cath-PCI 06/03/2021: (Inferior STEMI) mid RCA 100% occlusion - ostial to distal mid RCA PCI with 3 overlapping DES stents (distal 2.5 x 30, mid 3.0 x 18, ostial-proximal 3.5 x 22 Onyx Frontier DES) but  unable to pass stent to the distal 70% stenosis (very angulated).  ? ? ?Social History  ? ?Socioeconomic History  ? Marital status: Soil scientist  ?  Spouse name: Not on file  ? Number of children: Not on file  ? Years of education: Not on file  ? Highest education level: Not on file  ?Occupational History  ? Not on file  ?Tobacco Use  ? Smoking status: Former  ?  Packs/day: 1.00  ?  Years: 15.00  ?  Pack years: 15.00  ?  Types: Cigarettes  ?  Quit date: 06/03/2021  ?  Years since quitting: 0.2  ? Smokeless tobacco: Never  ?Substance and Sexual Activity  ? Alcohol use: Never  ? Drug use: Yes  ?  Types: Marijuana  ? Sexual activity: Yes  ?Other Topics Concern  ? Not on file  ?Social History Narrative  ? Not on file  ? ?Social Determinants of Health  ? ?Financial Resource Strain: Not on file  ?Food Insecurity: Not on file  ?Transportation Needs: Not on file  ?Physical Activity: Not on file  ?Stress: Not on file  ?Social Connections: Not on file   ?Intimate Partner Violence: Not on file  ? ? ?Past Surgical History:  ?Procedure Laterality Date  ? CORONARY/GRAFT ACUTE MI REVASCULARIZATION N/A 06/03/2021  ? Procedure: Coronary/Graft Acute MI Revascularization;  Surgeon: Belva Crome, MD;  Location: [facility];  Service: Cardiovascular;  Laterality: N/A;  ? CORONARY/GRAFT ACUTE MI REVASCULARIZATION N/A 06/07/2021  ? Procedure: Coronary/Graft Acute MI Revascularization;  Surgeon: Martinique, Peter M, MD;  Location: [facility];  Service: Cardiovascular;  Laterality: N/A;  ? LEFT HEART CATH AND CORONARY ANGIOGRAPHY N/A 06/03/2021  ? Procedure: LEFT HEART CATH AND CORONARY ANGIOGRAPHY;  Surgeon: Belva Crome, MD;  Location: [facility];  Service: Cardiovascular;  Laterality: N/A;  ? LEFT HEART CATH AND CORONARY ANGIOGRAPHY N/A 06/07/2021  ? Procedure: LEFT HEART CATH AND CORONARY ANGIOGRAPHY;  Surgeon: Martinique, Peter M, MD;  Location: [facility];  Service: Cardiovascular;  Laterality: N/A;  ? ? ?Family History  ?Problem Relation Age of Onset  ? Heart disease Mother   ? ? ?No Known Allergies ? ? ?  Latest Ref Rng & Units 06/10/2021  ?  7:37 AM 06/09/2021  ?  9:07 AM 06/07/2021  ?  6:45 AM  ?CBC  ?WBC 4.0 - 10.5 K/uL 7.2   8.7   17.1    ?Hemoglobin 12.0 - 15.0 g/dL 11.2   11.8   14.4    ?Hematocrit 36.0 - 46.0 % 33.9   34.8   43.1    ?Platelets 150 - 400 K/uL 408   390   278    ? ? ? ? ?CMP  ?   ?Component Value Date/Time  ? NA 143 07/30/2021 1534  ? K 4.1 07/30/2021 1534  ? CL 104 07/30/2021 1534  ? CO2 23 07/30/2021 1534  ? GLUCOSE 107 (H) 07/30/2021 1534  ? GLUCOSE 149 (H) 06/10/2021 0737  ? BUN 17 07/30/2021 1534  ? CREATININE 0.94 07/30/2021 1534  ? CALCIUM 8.7 07/30/2021 1534  ? PROT 6.1 07/30/2021 1534  ? ALBUMIN 4.1 07/30/2021 1534  ? AST 72 (H) 07/30/2021 1534  ? ALT 86 (H) 07/30/2021 1534  ? ALKPHOS 130 (H) 07/30/2021 1534  ? BILITOT <0.2 07/30/2021 1534  ? GFRNONAA >60 06/10/2021 0737  ? ? ? ?No results found. ? ?   ?Assessment  & Plan:  ? ?  1. Left carotid artery stenosis ?The patient remains asymptomatic with respect to the carotid stenosis.  However, the patient has now progressed and has a lesion the is >70%. ? ?Patient should undergo CT angiography of the carotid arteries to define the degree of stenosis of the internal carotid arteries bilaterally and the anatomic suitability for surgery vs. intervention. ? ?If the patient does indeed need surgery cardiac clearance will be required, once cleared the patient will be scheduled for surgery. ? ?The risks, benefits and alternative therapies were reviewed in detail with the patient.  All questions were answered.  The patient agrees to proceed with imaging. ? ?Continue antiplatelet therapy as prescribed. ?Continue management of CAD, HTN and Hyperlipidemia. ?Healthy heart diet, encouraged exercise at least 4 times per week.   ?- CT ANGIO NECK W OR WO CONTRAST; Future ? ?2. Claudication ([place]) ?The patient's description of pain in her lower extremities with exercise is concerning for claudication given her history of PAD.  The patient returns for follow-up regarding her CT scan, we will also have her get ABIs done so we can evaluate this as well. ?- VAS [place] ABI WITH/WO TBI; Future ? ?3. Primary hypertension ?Continue antihypertensive medications as already ordered, these medications have been reviewed and there are no changes at this time.  ? ?4. Hyperlipidemia LDL goal <70 ?Continue statin as ordered and reviewed, no changes at this time  ? ? ?Current Outpatient Medications on File Prior to Visit  ?Medication Sig Dispense Refill  ? aspirin 81 MG chewable tablet Chew 1 tablet (81 mg total) by mouth daily.    ? atorvastatin (LIPITOR) 80 MG tablet Take 1 tablet (80 mg total) by mouth daily. 30 tablet 12  ? isosorbide mononitrate (IMDUR) 30 MG 24 hr tablet Take 1 tablet (30 mg total) by mouth daily. 30 tablet 11  ? metoprolol succinate (TOPROL-XL) 50 MG 24 hr tablet Take 50 mg by mouth daily. Take  with or immediately following a meal.    ? nitroGLYCERIN (NITROSTAT) 0.4 MG SL tablet Place 1 tablet (0.4 mg total) under the tongue every 5 (five) minutes as needed for chest pain. 25 tablet 3  ? pantoprazole (P

## 2021-09-09 NOTE — Telephone Encounter (Signed)
Spoke with pt - she will call her insurance and get the name of where she can go for in network benefits for her CT. I advised to get the name, address, phone, fax and tax ID so we can make sure everything matches for the PA. Pt will return call when she has the information.  ?

## 2021-09-14 NOTE — Telephone Encounter (Signed)
Spoke with pt regarding her attempt to find an in network site for CT scan. Pt was audibly upset at the process. States that she can't get through to a "human" and she is frustrated and wanting to give up. Stated that she will just have a heart attack and that will take care of everything. I advised pt to try and calm down and assured her that I would call [employer] and try to find an in network site for her. I will touch base with pt when I have talked to [facility].  ?

## 2021-09-20 ENCOUNTER — Telehealth: Payer: Self-pay | Admitting: Internal Medicine

## 2021-09-20 ENCOUNTER — Other Ambulatory Visit: Payer: Self-pay

## 2021-09-20 DIAGNOSIS — K219 Gastro-esophageal reflux disease without esophagitis: Secondary | ICD-10-CM

## 2021-09-20 MED ORDER — PANTOPRAZOLE SODIUM 40 MG PO TBEC: 40.0000 mg | DELAYED_RELEASE_TABLET | Freq: Two times a day (BID) | ORAL | 2 refills | Status: DC

## 2021-09-20 NOTE — Telephone Encounter (Signed)
Patient called stating the last time she saw Dr. Lorenso Courier that she increased her Protonix to one tablet twice a day.  When picking up her prescription at the pharmacy, however, they are still giving her only 30 tablets.  The prescription needs to be updated with her pharmacy to quantity 60 with refills.  If there is an issue, please call patient and advise.  Thank you. ?

## 2021-09-20 NOTE — Telephone Encounter (Signed)
Outpatient Medication Detail ? ? Disp Refills Start End   ?pantoprazole (PROTONIX) 40 MG tablet 60 tablet 2 09/20/2021    ?Sig - Route: Take 1 tablet (40 mg total) by mouth 2 (two) times daily. - Oral   ?Sent to pharmacy as: pantoprazole (PROTONIX) 40 MG tablet   ?E-Prescribing Status: Receipt confirmed by pharmacy (09/20/2021 12:07 PM EDT)   ? ?Correction has been made to Rx and has been re-sent to pt preferred pharmacy. ?

## 2021-09-27 NOTE — Telephone Encounter (Signed)
Spoke with [employer] and we will need to do a peer to peer @ [phone] to try to get authorized. Pending reference # [id number]. I sent a team to Eulogio Ditch, NP today with information for peer to peer. I will follow back up with patient after the peer to peer.  ? ?I LVM with patient letting her know that we are still working on getting approval for her. The closest place that is in network for her will be [facility] @ [address] in [place].  ?

## 2021-09-29 ENCOUNTER — Other Ambulatory Visit (INDEPENDENT_AMBULATORY_CARE_PROVIDER_SITE_OTHER): Payer: Self-pay | Admitting: Nurse Practitioner

## 2021-09-29 NOTE — Telephone Encounter (Signed)
Eulogio Ditch, NP, performed a peer to peer and was able to get the CTA authorized with a # [id number].  ? ?The CT will need to be performed at [facility] in [place], [place] due to pt being out of network with facilities here. p: [phone]. fax: [phone]. Arna Medici will need to send a referral to [facility] for scheduling ?

## 2021-09-29 NOTE — Addendum Note (Signed)
Addended by: Kris Hartmann on: 09/29/2021 02:57 PM ? ? Modules accepted: Orders ? ?

## 2021-10-11 ENCOUNTER — Telehealth (INDEPENDENT_AMBULATORY_CARE_PROVIDER_SITE_OTHER): Payer: Self-pay | Admitting: Nurse Practitioner

## 2021-10-11 NOTE — Telephone Encounter (Signed)
Results from pt's CT scan at [facility] came via fax today and were given to Eulogio Ditch, NP for follow up. Nothing further needed at this time.  ?

## 2021-10-18 ENCOUNTER — Telehealth (INDEPENDENT_AMBULATORY_CARE_PROVIDER_SITE_OTHER): Payer: Self-pay

## 2021-10-18 NOTE — Telephone Encounter (Signed)
Pt is also having to take Nitro quite often. ?

## 2021-10-18 NOTE — Telephone Encounter (Signed)
I called and discussed with the patient her concerns and symptoms.  I strongly suggested to the patient that if she is taking her nitro repeatedly and having heart palpitations and she does not feel well she should go to the emergency room for evaluation.  Otherwise these are concerns that are addressed by her cardiologist and we would not address these concerns with vascular surgery.  We discussed her CT scan which showed a estimated 50% stenosis of the left ICA with no significant stenosis of the right.  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  Unfortunately the patient is out of network for [place].  We discussed this with the patient as well.  Based on this any follow-up or possible intervention the patient would likely incur complete out-of-pocket cost.  Following this discussion the patient would like referral to vascular surgery that would be in network for her.  The patient was agreeable to this.  The patient was again urged to contact her cardiologist to go to the emergency room if she is not feeling well.

## 2021-10-18 NOTE — Telephone Encounter (Signed)
Carrie Butler called and left VM saying that she needs the results from the tests she had.  She is not feeling well and is having arm and chest pains.  She also states that she has heart palpitations and her BP is up and down.  She states she knows she has blockages and someone is supposed to be taking care of the problem.  She states "I need something done so I don't die like today".  Please advise what steps are next. ?

## 2021-11-03 ENCOUNTER — Ambulatory Visit: Payer: Commercial Managed Care - HMO | Admitting: Cardiology

## 2021-11-03 ENCOUNTER — Encounter: Payer: Self-pay | Admitting: Cardiology

## 2021-11-03 VITALS — BP 106/56 | HR 72 | Ht <= 58 in | Wt 85.4 lb

## 2021-11-03 DIAGNOSIS — E7841 Elevated Lipoprotein(a): Secondary | ICD-10-CM | POA: Diagnosis not present

## 2021-11-03 DIAGNOSIS — I25118 Atherosclerotic heart disease of native coronary artery with other forms of angina pectoris: Secondary | ICD-10-CM | POA: Diagnosis not present

## 2021-11-03 DIAGNOSIS — I10 Essential (primary) hypertension: Secondary | ICD-10-CM | POA: Diagnosis not present

## 2021-11-03 DIAGNOSIS — E785 Hyperlipidemia, unspecified: Secondary | ICD-10-CM | POA: Diagnosis not present

## 2021-11-03 MED ORDER — RANOLAZINE ER 500 MG PO TB12
500.0000 mg | ORAL_TABLET | Freq: Two times a day (BID) | ORAL | 3 refills | Status: DC
Start: 2021-11-03 — End: 2021-11-23

## 2021-11-03 NOTE — Progress Notes (Signed)
[place]  Cardiology Office Note:    Date:  11/03/2021   ID:  [name], DOB 1960/12/09, MRN [id number]  PCP:  Pcp, No  Cardiologist:  Shirlee More, MD    Referring MD: No ref. provider found    ASSESSMENT:    1. Coronary artery disease involving native coronary artery of native heart with other form of angina pectoris ([place])   2. Hyperlipidemia LDL goal <70   3. Elevated lipoprotein(a)   4. Primary hypertension    PLAN:    In order of problems listed above:  Despite multiple PCI she has ongoing angina limiting New York Heart Association class II to class III continue medical treatment including her dual antiplatelet beta-blocker oral nitrate we will add ranolazine and if the symptoms do not respond or worsen may need to consider repeat coronary angiography LDL at target continue her statin BP at target continue current treatment   Next appointment: 3 months   Medication Adjustments/Labs and Tests Ordered: Current medicines are reviewed at length with the patient today.  Concerns regarding medicines are outlined above.  No orders of the defined types were placed in this encounter.  No orders of the defined types were placed in this encounter.   Chief Complaint  Patient presents with   Follow-up   Coronary Artery Disease   Hyperlipidemia    History of Present Illness:    Carrie Butler is a 61 y.o. female with a hx of CAD with ST elevation myocardial infarction with PCI and 3 stents.  Echocardiogram 06/03/2021 with extensive residual disease distal to the multiple stents in the ostial left circumflex artery not felt to be amenable to PCI, hypertensive heart disease of moderate LVH normal ejection fraction hyperlipidemia hypokalemia tobacco use and hyper LP(a) last seen 07/30/2021.  Her LP(a) level was 188 on a statin.  Her LDL is at target at 49.  Compliance with diet, lifestyle and medications: Yes  She is not doing well despite  compliance with medications and cardiac rehab she is extremely limited anything more than usual activities walking outdoors she has typical angina usually relieved with rest but often needs a nitroglycerin fatigue and shortness of breath. She also has a difficult home situation causing great stress in her life at times has angina with emotional upset She is not having edema orthopnea palpitation or syncope She has had multiple cardiac interventions and is on good medical regimen we will try to lessen symptoms initiating ranolazine She tolerates her statin without muscle pain or weakness and her last lipid profile 07/30/2021 showed an LDL of 49 Past Medical History:  Diagnosis Date   Coronary artery disease    Coronary artery disease involving native coronary artery of native heart with unstable angina pectoris ([place]) 06/09/2021   Cardiac Cath-PCI 06/03/2021: Mid RCA 100% occlusion - ostial to distal mid RCA PCI with 3 overlapping DES stents (distal 2.5 x 30, mid 3.0 x 18, ostial-proximal 3.5 x 22 Onyx Frontier DES) but unable to pass stent to the distal 70% stenosis (very angulated).  Also noted ostial LCx 70 to 80% and OM1 mid 70% (recommended medical therapy).. Cardiac Cath 06/07/2021: mid-distal LM 30%-< Ost LCx ~80% (s   Hyperlipidemia    Hypertension    Presence of drug coated stent in right coronary artery 06/09/2021   Cardiac Cath-PCI 06/03/2021: (Inferior STEMI) mid RCA 100% occlusion - ostial to distal mid RCA PCI with 3 overlapping DES stents (distal 2.5 x 30, mid 3.0  x 18, ostial-proximal 3.5 x 22 Onyx Frontier DES) but unable to pass stent to the distal 70% stenosis (very angulated).    Past Surgical History:  Procedure Laterality Date   CORONARY/GRAFT ACUTE MI REVASCULARIZATION N/A 06/03/2021   Procedure: Coronary/Graft Acute MI Revascularization;  Surgeon: Belva Crome, MD;  Location: [facility];  Service: Cardiovascular;  Laterality: N/A;   CORONARY/GRAFT ACUTE MI  REVASCULARIZATION N/A 06/07/2021   Procedure: Coronary/Graft Acute MI Revascularization;  Surgeon: Martinique, Peter M, MD;  Location: [facility];  Service: Cardiovascular;  Laterality: N/A;   LEFT HEART CATH AND CORONARY ANGIOGRAPHY N/A 06/03/2021   Procedure: LEFT HEART CATH AND CORONARY ANGIOGRAPHY;  Surgeon: Belva Crome, MD;  Location: [facility];  Service: Cardiovascular;  Laterality: N/A;   LEFT HEART CATH AND CORONARY ANGIOGRAPHY N/A 06/07/2021   Procedure: LEFT HEART CATH AND CORONARY ANGIOGRAPHY;  Surgeon: Martinique, Peter M, MD;  Location: [facility];  Service: Cardiovascular;  Laterality: N/A;    Current Medications: Current Meds  Medication Sig   aspirin 81 MG chewable tablet Chew 1 tablet (81 mg total) by mouth daily.   atorvastatin (LIPITOR) 80 MG tablet Take 1 tablet (80 mg total) by mouth daily.   isosorbide mononitrate (IMDUR) 30 MG 24 hr tablet Take 1 tablet (30 mg total) by mouth daily.   metoprolol succinate (TOPROL-XL) 50 MG 24 hr tablet Take 50 mg by mouth daily. Take with or immediately following a meal.   nitroGLYCERIN (NITROSTAT) 0.4 MG SL tablet Place 1 tablet (0.4 mg total) under the tongue every 5 (five) minutes as needed for chest pain.   pantoprazole (PROTONIX) 40 MG tablet Take 1 tablet (40 mg total) by mouth 2 (two) times daily.   ticagrelor (BRILINTA) 90 MG TABS tablet Take 1 tablet (90 mg total) by mouth 2 (two) times daily.     Allergies:   Patient has no known allergies.   Social History   Socioeconomic History   Marital status: Soil scientist    Spouse name: Not on file   Number of children: Not on file   Years of education: Not on file   Highest education level: Not on file  Occupational History   Not on file  Tobacco Use   Smoking status: Former    Packs/day: 1.00    Years: 15.00    Pack years: 15.00    Types: Cigarettes    Quit date: 06/03/2021    Years since quitting: 0.4    Passive exposure: Past   Smokeless  tobacco: Never  Substance and Sexual Activity   Alcohol use: Never   Drug use: Yes    Types: Marijuana   Sexual activity: Yes  Other Topics Concern   Not on file  Social History Narrative   Not on file   Social Determinants of Health   Financial Resource Strain: Not on file  Food Insecurity: Not on file  Transportation Needs: Not on file  Physical Activity: Not on file  Stress: Not on file  Social Connections: Not on file     Family History: The patient's family history includes Heart disease in her mother. ROS:   Please see the history of present illness.    All other systems reviewed and are negative.  EKGs/Labs/Other Studies Reviewed:    The following studies were reviewed today:  EKG:  EKG ordered today and personally reviewed.  The ekg ordered today demonstrates sinus rhythm left atrial enlargement LVH ST-T abnormality improved  from previous EKG in December  Recent Labs: 06/10/2021: Hemoglobin 11.2; Platelets 408 07/30/2021: ALT 86; BUN 17; Creatinine, Ser 0.94; Potassium 4.1; Sodium 143  Recent Lipid Panel    Component Value Date/Time   CHOL 132 07/30/2021 1534   TRIG 147 07/30/2021 1534   HDL 58 07/30/2021 1534   CHOLHDL 2.3 07/30/2021 1534   CHOLHDL 3.1 06/03/2021 1623   VLDL 10 06/03/2021 1623   LDLCALC 49 07/30/2021 1534    Physical Exam:    VS:  BP (!) 106/56 (BP Location: Right Arm)   Pulse 72   Ht '4\' 10"'$  (1.473 m)   Wt 85 lb 6.4 oz (38.7 kg)   SpO2 98%   BMI 17.85 kg/m     Wt Readings from Last 3 Encounters:  11/03/21 85 lb 6.4 oz (38.7 kg)  09/08/21 85 lb 12.8 oz (38.9 kg)  07/30/21 86 lb (39 kg)     GEN: Very small stature tearful well nourished, well developed in no acute distress HEENT: Normal NECK: No JVD; No carotid bruits LYMPHATICS: No lymphadenopathy CARDIAC: RRR, no murmurs, rubs, gallops RESPIRATORY:  Clear to auscultation without rales, wheezing or rhonchi  ABDOMEN: Soft, non-tender, non-distended MUSCULOSKELETAL:  No  edema; No deformity  SKIN: Warm and dry NEUROLOGIC:  Alert and oriented x 3 PSYCHIATRIC:  Normal affect    Signed, Shirlee More, MD  11/03/2021 2:58 PM    [place]

## 2021-11-03 NOTE — Patient Instructions (Signed)
Medication Instructions:  Your physician has recommended you make the following change in your medication:   START: Ranolazine 500 mg twice daily  *If you need a refill on your cardiac medications before your next appointment, please call your pharmacy*   Lab Work: None If you have labs (blood work) drawn today and your tests are completely normal, you will receive your results only by: [place] (if you have MyChart) OR A paper copy in the mail If you have any lab test that is abnormal or we need to change your treatment, we will call you to review the results.   Testing/Procedures: None   Follow-Up: At [facility], you and your health needs are our priority.  As part of our continuing mission to provide you with exceptional heart care, we have created designated Provider Care Teams.  These Care Teams include your primary Cardiologist (physician) and Advanced Practice Providers (APPs -  Physician Assistants and Nurse Practitioners) who all work together to provide you with the care you need, when you need it.  We recommend signing up for the patient portal called "MyChart".  Sign up information is provided on this After Visit Summary.  MyChart is used to connect with patients for Virtual Visits (Telemedicine).  Patients are able to view lab/test results, encounter notes, upcoming appointments, etc.  Non-urgent messages can be sent to your provider as well.   To learn more about what you can do with MyChart, go to [email or web address].    Your next appointment:   3 month(s)  The format for your next appointment:   In Person  Provider:   Shirlee More, MD    Other Instructions None  Important Information About Sugar

## 2021-11-03 NOTE — Progress Notes (Deleted)
Cardiology Office Note:    Date:  11/03/2021   ID:  VOLLIE AARON, DOB 1961/05/09, MRN [id number]  PCP:  Pcp, No  Cardiologist:  Shirlee More, MD    Referring MD: No ref. provider found    ASSESSMENT:    No diagnosis found. PLAN:    In order of problems listed above:  ***   Next appointment: ***   Medication Adjustments/Labs and Tests Ordered: Current medicines are reviewed at length with the patient today.  Concerns regarding medicines are outlined above.  No orders of the defined types were placed in this encounter.  No orders of the defined types were placed in this encounter.   No chief complaint on file.   History of Present Illness:    Carrie Butler is a 61 y.o. female with a hx of CAD with ST elevation myocardial infarction with PCI and 3 stents.  Echocardiogram 06/03/2021 with extensive residual disease distal to the multiple stents in the ostial left circumflex artery not felt to be amenable to PCI, hypertensive heart disease of moderate LVH normal ejection fraction hyperlipidemia hypokalemia tobacco use and hyper LP(a) last seen 07/30/2021.  Her LP(a) level was 188 on a statin.  Her LDL is at target at 49. Compliance with diet, lifestyle and medications: *** Past Medical History:  Diagnosis Date   Coronary artery disease    Coronary artery disease involving native coronary artery of native heart with unstable angina pectoris ([place]) 06/09/2021   Cardiac Cath-PCI 06/03/2021: Mid RCA 100% occlusion - ostial to distal mid RCA PCI with 3 overlapping DES stents (distal 2.5 x 30, mid 3.0 x 18, ostial-proximal 3.5 x 22 Onyx Frontier DES) but unable to pass stent to the distal 70% stenosis (very angulated).  Also noted ostial LCx 70 to 80% and OM1 mid 70% (recommended medical therapy).. Cardiac Cath 06/07/2021: mid-distal LM 30%-< Ost LCx ~80% (s   Hyperlipidemia    Hypertension    Presence of drug coated stent in right coronary artery 06/09/2021   Cardiac  Cath-PCI 06/03/2021: (Inferior STEMI) mid RCA 100% occlusion - ostial to distal mid RCA PCI with 3 overlapping DES stents (distal 2.5 x 30, mid 3.0 x 18, ostial-proximal 3.5 x 22 Onyx Frontier DES) but unable to pass stent to the distal 70% stenosis (very angulated).    Past Surgical History:  Procedure Laterality Date   CORONARY/GRAFT ACUTE MI REVASCULARIZATION N/A 06/03/2021   Procedure: Coronary/Graft Acute MI Revascularization;  Surgeon: Belva Crome, MD;  Location: [facility];  Service: Cardiovascular;  Laterality: N/A;   CORONARY/GRAFT ACUTE MI REVASCULARIZATION N/A 06/07/2021   Procedure: Coronary/Graft Acute MI Revascularization;  Surgeon: Martinique, Peter M, MD;  Location: [facility];  Service: Cardiovascular;  Laterality: N/A;   LEFT HEART CATH AND CORONARY ANGIOGRAPHY N/A 06/03/2021   Procedure: LEFT HEART CATH AND CORONARY ANGIOGRAPHY;  Surgeon: Belva Crome, MD;  Location: [facility];  Service: Cardiovascular;  Laterality: N/A;   LEFT HEART CATH AND CORONARY ANGIOGRAPHY N/A 06/07/2021   Procedure: LEFT HEART CATH AND CORONARY ANGIOGRAPHY;  Surgeon: Martinique, Peter M, MD;  Location: [facility];  Service: Cardiovascular;  Laterality: N/A;    Current Medications: No outpatient medications have been marked as taking for the 11/03/21 encounter (Appointment) with Richardo Priest, MD.     Allergies:   Patient has no known allergies.   Social History   Socioeconomic History   Marital status: Soil scientist    Spouse name:  Not on file   Number of children: Not on file   Years of education: Not on file   Highest education level: Not on file  Occupational History   Not on file  Tobacco Use   Smoking status: Former    Packs/day: 1.00    Years: 15.00    Pack years: 15.00    Types: Cigarettes    Quit date: 06/03/2021    Years since quitting: 0.4   Smokeless tobacco: Never  Substance and Sexual Activity   Alcohol use: Never   Drug use: Yes     Types: Marijuana   Sexual activity: Yes  Other Topics Concern   Not on file  Social History Narrative   Not on file   Social Determinants of Health   Financial Resource Strain: Not on file  Food Insecurity: Not on file  Transportation Needs: Not on file  Physical Activity: Not on file  Stress: Not on file  Social Connections: Not on file     Family History: The patient's ***family history includes Heart disease in her mother. ROS:   Please see the history of present illness.    All other systems reviewed and are negative.  EKGs/Labs/Other Studies Reviewed:    The following studies were reviewed today:  EKG:  EKG ordered today and personally reviewed.  The ekg ordered today demonstrates ***  Recent Labs: 06/10/2021: Hemoglobin 11.2; Platelets 408 07/30/2021: ALT 86; BUN 17; Creatinine, Ser 0.94; Potassium 4.1; Sodium 143  Recent Lipid Panel    Component Value Date/Time   CHOL 132 07/30/2021 1534   TRIG 147 07/30/2021 1534   HDL 58 07/30/2021 1534   CHOLHDL 2.3 07/30/2021 1534   CHOLHDL 3.1 06/03/2021 1623   VLDL 10 06/03/2021 1623   LDLCALC 49 07/30/2021 1534    Physical Exam:    VS:  There were no vitals taken for this visit.    Wt Readings from Last 3 Encounters:  09/08/21 85 lb 12.8 oz (38.9 kg)  07/30/21 86 lb (39 kg)  06/21/21 80 lb 6.4 oz (36.5 kg)     GEN: *** Well nourished, well developed in no acute distress HEENT: Normal NECK: No JVD; No carotid bruits LYMPHATICS: No lymphadenopathy CARDIAC: ***RRR, no murmurs, rubs, gallops RESPIRATORY:  Clear to auscultation without rales, wheezing or rhonchi  ABDOMEN: Soft, non-tender, non-distended MUSCULOSKELETAL:  No edema; No deformity  SKIN: Warm and dry NEUROLOGIC:  Alert and oriented x 3 PSYCHIATRIC:  Normal affect    Signed, Shirlee More, MD  11/03/2021 11:24 AM    [place]

## 2021-11-23 ENCOUNTER — Telehealth: Payer: Self-pay

## 2021-11-23 ENCOUNTER — Telehealth: Payer: Self-pay | Admitting: Cardiology

## 2021-11-23 NOTE — Telephone Encounter (Signed)
   Pt c/o medication issue:  1. Name of Medication: ranolazine (RANEXA) 500 MG 12 hr tablet  2. How are you currently taking this medication (dosage and times per day)? Take 1 tablet (500 mg total) by mouth 2 (two) times daily.  3. Are you having a reaction (difficulty breathing--STAT)? No   4. What is your medication issue? Pt said, she's going to stopped taking this medication because she gets severe headache, nauseous, muscle aches, heart palpitations, upset stomach. She thought she had covid and went to the hospital. She said, she read the description of this meds and realized its all the side effects. She said, she can't take this med anymore.

## 2021-11-23 NOTE — Telephone Encounter (Signed)
Spoke with pt regarding Ranexa. Advised Dr. Bettina Gavia said just stop the medication. She had no further questions or concerns.

## 2021-12-08 ENCOUNTER — Encounter (INDEPENDENT_AMBULATORY_CARE_PROVIDER_SITE_OTHER): Payer: Self-pay | Admitting: Nurse Practitioner

## 2021-12-08 ENCOUNTER — Ambulatory Visit (INDEPENDENT_AMBULATORY_CARE_PROVIDER_SITE_OTHER): Payer: Commercial Managed Care - HMO

## 2021-12-08 ENCOUNTER — Ambulatory Visit (INDEPENDENT_AMBULATORY_CARE_PROVIDER_SITE_OTHER): Payer: Commercial Managed Care - HMO | Admitting: Nurse Practitioner

## 2021-12-08 VITALS — BP 218/122 | HR 47 | Resp 15 | Wt 85.8 lb

## 2021-12-08 DIAGNOSIS — I6503 Occlusion and stenosis of bilateral vertebral arteries: Secondary | ICD-10-CM | POA: Diagnosis not present

## 2021-12-08 DIAGNOSIS — I70213 Atherosclerosis of native arteries of extremities with intermittent claudication, bilateral legs: Secondary | ICD-10-CM | POA: Diagnosis not present

## 2021-12-08 DIAGNOSIS — I6522 Occlusion and stenosis of left carotid artery: Secondary | ICD-10-CM | POA: Diagnosis not present

## 2021-12-08 DIAGNOSIS — I739 Peripheral vascular disease, unspecified: Secondary | ICD-10-CM | POA: Diagnosis not present

## 2021-12-08 DIAGNOSIS — E785 Hyperlipidemia, unspecified: Secondary | ICD-10-CM

## 2021-12-08 DIAGNOSIS — I10 Essential (primary) hypertension: Secondary | ICD-10-CM

## 2021-12-08 DIAGNOSIS — Z720 Tobacco use: Secondary | ICD-10-CM

## 2021-12-08 NOTE — H&P (View-Only) (Signed)
Subjective:    Patient ID: Carrie Butler, female    DOB: [birth year], 61 y.o.   MRN: [id number] Chief Complaint  Patient presents with   Follow-up    Ultrasound follow up    Carrie Butler is a 61 year old female that presents today as a referral from [place], [place] in regards to carotid artery stenosis.  On 06/03/2021 the patient had a STEMI and then subsequently on 06/07/2021 the patient had an NSTEMI.  During this time the patient had a carotid duplex performed which showed a 1 to 39% stenosis of the right ICA with an 80 to 99% stenosis of the left ICA.  Currently the patient is in cardiac rehab and notes that she has significant pain and cramping in her lower extremities primarily in the calves with extensive activity.  Her biggest concern is her severe fatigue that also occurs with any level of activity.  She also endorses having dizziness at times.  Since her recent intervention she denies any chest pain.  Previously the patient does not have any TIA or strokelike symptoms.  Today noninvasive studies show an ABI of 1.03 on the right with a TBI 0.93.  The left has an ABI 0.92 with a TBI 0.46.  The right tibial artery waveforms are triphasic with good toe waveforms.  The left lower extremity has triphasic/monophasic waveforms with dampened toe waveforms.  The patient also underwent a CT scan to evaluate for her carotid stenosis.  The left carotid showed an estimated 50% stenosis of the left ICA.  The right ICA noted no significant stenosis or occlusion.  The patient has codominant vertebral arteries with a greater than 75% vertebral artery stenosis with a 50% stenosis of the right vertebral artery.  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   Review of Systems  Cardiovascular:        Claudication  Neurological:  Positive for dizziness.  All other systems reviewed and are negative.      Objective:   Physical Exam Vitals reviewed.  HENT:     Head: Normocephalic.   Cardiovascular:     Rate and Rhythm: Normal rate.  Pulmonary:     Effort: Pulmonary effort is normal.  Skin:    General: Skin is warm and dry.  Neurological:     Mental Status: She is alert and oriented to person, place, and time.  Psychiatric:        Mood and Affect: Mood normal.        Behavior: Behavior normal.        Thought Content: Thought content normal.        Judgment: Judgment normal.     BP (!) 218/122 (BP Location: Left Arm)   Pulse (!) 47   Resp 15   Wt 85 lb 12.8 oz (38.9 kg)   BMI 17.93 kg/m   Past Medical History:  Diagnosis Date   Coronary artery disease    Coronary artery disease involving native coronary artery of native heart with unstable angina pectoris ([place]) 06/09/2021   Cardiac Cath-PCI 06/03/2021: Mid RCA 100% occlusion - ostial to distal mid RCA PCI with 3 overlapping DES stents (distal 2.5 x 30, mid 3.0 x 18, ostial-proximal 3.5 x 22 Onyx Frontier DES) but unable to pass stent to the distal 70% stenosis (very angulated).  Also noted ostial LCx 70 to 80% and OM1 mid 70% (recommended medical therapy).. Cardiac Cath 06/07/2021: mid-distal LM 30%-< Ost LCx ~80% (s   Hyperlipidemia  Hypertension    Presence of drug coated stent in right coronary artery 06/09/2021   Cardiac Cath-PCI 06/03/2021: (Inferior STEMI) mid RCA 100% occlusion - ostial to distal mid RCA PCI with 3 overlapping DES stents (distal 2.5 x 30, mid 3.0 x 18, ostial-proximal 3.5 x 22 Onyx Frontier DES) but unable to pass stent to the distal 70% stenosis (very angulated).    Social History   Socioeconomic History   Marital status: Soil scientist    Spouse name: Not on file   Number of children: Not on file   Years of education: Not on file   Highest education level: Not on file  Occupational History   Not on file  Tobacco Use   Smoking status: Former    Packs/day: 1.00    Years: 15.00    Total pack years: 15.00    Types: Cigarettes    Quit date: 06/03/2021    Years since  quitting: 0.5    Passive exposure: Past   Smokeless tobacco: Never  Substance and Sexual Activity   Alcohol use: Never   Drug use: Yes    Types: Marijuana   Sexual activity: Yes  Other Topics Concern   Not on file  Social History Narrative   Not on file   Social Determinants of Health   Financial Resource Strain: Not on file  Food Insecurity: Not on file  Transportation Needs: Not on file  Physical Activity: Not on file  Stress: Not on file  Social Connections: Not on file  Intimate Partner Violence: Not on file    Past Surgical History:  Procedure Laterality Date   CORONARY/GRAFT ACUTE MI REVASCULARIZATION N/A 06/03/2021   Procedure: Coronary/Graft Acute MI Revascularization;  Surgeon: Belva Crome, MD;  Location: [facility];  Service: Cardiovascular;  Laterality: N/A;   CORONARY/GRAFT ACUTE MI REVASCULARIZATION N/A 06/07/2021   Procedure: Coronary/Graft Acute MI Revascularization;  Surgeon: Martinique, Peter M, MD;  Location: [facility];  Service: Cardiovascular;  Laterality: N/A;   LEFT HEART CATH AND CORONARY ANGIOGRAPHY N/A 06/03/2021   Procedure: LEFT HEART CATH AND CORONARY ANGIOGRAPHY;  Surgeon: Belva Crome, MD;  Location: [facility];  Service: Cardiovascular;  Laterality: N/A;   LEFT HEART CATH AND CORONARY ANGIOGRAPHY N/A 06/07/2021   Procedure: LEFT HEART CATH AND CORONARY ANGIOGRAPHY;  Surgeon: Martinique, Peter M, MD;  Location: [facility];  Service: Cardiovascular;  Laterality: N/A;    Family History  Problem Relation Age of Onset   Heart disease Mother     No Known Allergies     Latest Ref Rng & Units 06/10/2021    7:37 AM 06/09/2021    9:07 AM 06/07/2021    6:45 AM  CBC  WBC 4.0 - 10.5 K/uL 7.2  8.7  17.1   Hemoglobin 12.0 - 15.0 g/dL 11.2  11.8  14.4   Hematocrit 36.0 - 46.0 % 33.9  34.8  43.1   Platelets 150 - 400 K/uL 408  390  278       CMP     Component Value Date/Time   NA 143 07/30/2021 1534   K 4.1  07/30/2021 1534   CL 104 07/30/2021 1534   CO2 23 07/30/2021 1534   GLUCOSE 107 (H) 07/30/2021 1534   GLUCOSE 149 (H) 06/10/2021 0737   BUN 17 07/30/2021 1534   CREATININE 0.94 07/30/2021 1534   CALCIUM 8.7 07/30/2021 1534   PROT 6.1 07/30/2021 1534   ALBUMIN 4.1 07/30/2021 1534  AST 72 (H) 07/30/2021 1534   ALT 86 (H) 07/30/2021 1534   ALKPHOS 130 (H) 07/30/2021 1534   BILITOT <0.2 07/30/2021 1534   GFRNONAA >60 06/10/2021 0737     No results found.     Assessment & Plan:   1. Left carotid artery stenosis Recommend:  The patient's duplex was in stark contrast to her CT scan.  The CT scan noted a 50% stenosis of the left ICA with no evidence of significant stenosis on the right ICA.  Based on this we do not recommend any intervention at this time but we will continue to follow.  Continue antiplatelet therapy as prescribed Continue management of CAD, HTN and Hyperlipidemia Healthy heart diet,  encouraged exercise at least 4 times per week Follow up in 6 months with duplex ultrasound and physical exam    2. 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  She is currently codominant.  Typically intervention with vertebral arteries is difficult especially if they are less than 4 mm in size, and in fact intervention can sometimes cause more harm than benefit.  The patient is concerned due to having some significant dizziness, however her greatest concern is her inability to walk with her severe claudication.  Therefore we will proceed with intervention of her lower extremities and we can discuss possible angiogram for evaluation of her vertebral arteries at a follow-up date.  3. Atherosclerosis of native artery of both lower extremities with intermittent claudication (HCC) Recommend:  The patient has experienced increased claudication symptoms and is now describing lifestyle limiting  claudication and appears to be having mild rest pain symptroms.  Given the severity of the patient's severe left lower extremity symptoms the patient should undergo angiography with the hope for intervention.  Risk and benefits were reviewed the patient.  Indications for the procedure were reviewed.  All questions were answered, the patient agrees to proceed with left lower extremity angiography and possible intervention.   The patient should continue walking and begin a more formal exercise program.  The patient should continue antiplatelet therapy and aggressive treatment of the lipid abnormalities  The patient will follow up with me after the angiogram.    4. Primary hypertension Continue antihypertensive medications as already ordered, these medications have been reviewed and there are no changes at this time.   5. Hyperlipidemia LDL goal <70 Continue statin as ordered and reviewed, no changes at this time   6. Tobacco use The patient has stopped smoking.  Continued cessation is encouraged   Current Outpatient Medications on File Prior to Visit  Medication Sig Dispense Refill   aspirin 81 MG chewable tablet Chew 1 tablet (81 mg total) by mouth daily.     atorvastatin (LIPITOR) 80 MG tablet Take 1 tablet (80 mg total) by mouth daily. 30 tablet 12   isosorbide mononitrate (IMDUR) 30 MG 24 hr tablet Take 1 tablet (30 mg total) by mouth daily. 30 tablet 11   metoprolol succinate (TOPROL-XL) 50 MG 24 hr tablet Take 50 mg by mouth daily. Take with or immediately following a meal.     nitroGLYCERIN (NITROSTAT) 0.4 MG SL tablet Place 1 tablet (0.4 mg total) under the tongue every 5 (five) minutes as needed for chest pain. 25 tablet 3   pantoprazole (PROTONIX) 40 MG tablet Take 1 tablet (40 mg total) by mouth 2 (two) times daily. 60 tablet 2  ticagrelor (BRILINTA) 90 MG TABS tablet Take 1 tablet (90 mg total) by mouth 2 (two) times daily. 60 tablet 12   No current facility-administered  medications on file prior to visit.    There are no Patient Instructions on file for this visit. No follow-ups on file.   Kris Hartmann, NP

## 2021-12-08 NOTE — Progress Notes (Signed)
Subjective:    Patient ID: Carrie Butler, female    DOB: [birth year], 61 y.o.   MRN: [id number] Chief Complaint  Patient presents with  . Follow-up    Ultrasound follow up    Carrie Butler is a 61 year old female that presents today as a referral from [place], [place] in regards to carotid artery stenosis.  On 06/03/2021 the patient had a STEMI and then subsequently on 06/07/2021 the patient had an NSTEMI.  During this time the patient had a carotid duplex performed which showed a 1 to 39% stenosis of the right ICA with an 80 to 99% stenosis of the left ICA.  Currently the patient is in cardiac rehab and notes that she has significant pain and cramping in her lower extremities primarily in the calves with extensive activity.  Her biggest concern is her severe fatigue that also occurs with any level of activity.  She also endorses having dizziness at times.  Since her recent intervention she denies any chest pain.  Previously the patient does not have any TIA or strokelike symptoms.  Today noninvasive studies show an ABI of 1.03 on the right with a TBI 0.93.  The left has an ABI 0.92 with a TBI 0.46.  The right tibial artery waveforms are triphasic with good toe waveforms.  The left lower extremity has triphasic/monophasic waveforms with dampened toe waveforms.  The patient also underwent a CT scan to evaluate for her carotid stenosis.  The left carotid showed an estimated 50% stenosis of the left ICA.  The right ICA noted no significant stenosis or occlusion.  The patient has codominant vertebral arteries with a greater than 75% vertebral artery stenosis with a 50% stenosis of the right vertebral artery.  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   Review of Systems  Cardiovascular:        Claudication  Neurological:  Positive for dizziness.  All other systems reviewed and are negative.      Objective:   Physical Exam  BP (!) 218/122 (BP Location: Left Arm)   Pulse  (!) 47   Resp 15   Wt 85 lb 12.8 oz (38.9 kg)   BMI 17.93 kg/m   Past Medical History:  Diagnosis Date  . Coronary artery disease   . Coronary artery disease involving native coronary artery of native heart with unstable angina pectoris (HCC) 06/09/2021   Cardiac Cath-PCI 06/03/2021: Mid RCA 100% occlusion - ostial to distal mid RCA PCI with 3 overlapping DES stents (distal 2.5 x 30, mid 3.0 x 18, ostial-proximal 3.5 x 22 Onyx Frontier DES) but unable to pass stent to the distal 70% stenosis (very angulated).  Also noted ostial LCx 70 to 80% and OM1 mid 70% (recommended medical therapy).. Cardiac Cath 06/07/2021: mid-distal LM 30%-< Ost LCx ~80% (s  . Hyperlipidemia   . Hypertension   . Presence of drug coated stent in right coronary artery 06/09/2021   Cardiac Cath-PCI 06/03/2021: (Inferior STEMI) mid RCA 100% occlusion - ostial to distal mid RCA PCI with 3 overlapping DES stents (distal 2.5 x 30, mid 3.0 x 18, ostial-proximal 3.5 x 22 Onyx Frontier DES) but unable to pass stent to the distal 70% stenosis (very angulated).    Social History   Socioeconomic History  . Marital status: Media planner    Spouse name: Not on file  . Number of children: Not on file  . Years of education: Not on file  . Highest education level: Not on  file  Occupational History  . Not on file  Tobacco Use  . Smoking status: Former    Packs/day: 1.00    Years: 15.00    Total pack years: 15.00    Types: Cigarettes    Quit date: 06/03/2021    Years since quitting: 0.5    Passive exposure: Past  . Smokeless tobacco: Never  Substance and Sexual Activity  . Alcohol use: Never  . Drug use: Yes    Types: Marijuana  . Sexual activity: Yes  Other Topics Concern  . Not on file  Social History Narrative  . Not on file   Social Determinants of Health   Financial Resource Strain: Not on file  Food Insecurity: Not on file  Transportation Needs: Not on file  Physical Activity: Not on file  Stress:  Not on file  Social Connections: Not on file  Intimate Partner Violence: Not on file    Past Surgical History:  Procedure Laterality Date  . CORONARY/GRAFT ACUTE MI REVASCULARIZATION N/A 06/03/2021   Procedure: Coronary/Graft Acute MI Revascularization;  Surgeon: Lyn Records, MD;  Location: [facility];  Service: Cardiovascular;  Laterality: N/A;  . CORONARY/GRAFT ACUTE MI REVASCULARIZATION N/A 06/07/2021   Procedure: Coronary/Graft Acute MI Revascularization;  Surgeon: Swaziland, Peter M, MD;  Location: [facility];  Service: Cardiovascular;  Laterality: N/A;  . LEFT HEART CATH AND CORONARY ANGIOGRAPHY N/A 06/03/2021   Procedure: LEFT HEART CATH AND CORONARY ANGIOGRAPHY;  Surgeon: Lyn Records, MD;  Location: [facility];  Service: Cardiovascular;  Laterality: N/A;  . LEFT HEART CATH AND CORONARY ANGIOGRAPHY N/A 06/07/2021   Procedure: LEFT HEART CATH AND CORONARY ANGIOGRAPHY;  Surgeon: Swaziland, Peter M, MD;  Location: [facility];  Service: Cardiovascular;  Laterality: N/A;    Family History  Problem Relation Age of Onset  . Heart disease Mother     No Known Allergies     Latest Ref Rng & Units 06/10/2021    7:37 AM 06/09/2021    9:07 AM 06/07/2021    6:45 AM  CBC  WBC 4.0 - 10.5 K/uL 7.2  8.7  17.1   Hemoglobin 12.0 - 15.0 g/dL 16.1  09.6  04.5   Hematocrit 36.0 - 46.0 % 33.9  34.8  43.1   Platelets 150 - 400 K/uL 408  390  278       CMP     Component Value Date/Time   NA 143 07/30/2021 1534   K 4.1 07/30/2021 1534   CL 104 07/30/2021 1534   CO2 23 07/30/2021 1534   GLUCOSE 107 (H) 07/30/2021 1534   GLUCOSE 149 (H) 06/10/2021 0737   BUN 17 07/30/2021 1534   CREATININE 0.94 07/30/2021 1534   CALCIUM 8.7 07/30/2021 1534   PROT 6.1 07/30/2021 1534   ALBUMIN 4.1 07/30/2021 1534   AST 72 (H) 07/30/2021 1534   ALT 86 (H) 07/30/2021 1534   ALKPHOS 130 (H) 07/30/2021 1534   BILITOT <0.2 07/30/2021 1534   GFRNONAA >60 06/10/2021 0737      No results found.     Assessment & Plan:   1. Left carotid artery stenosis Recommend:  The patient's duplex was in stark contrast to her CT scan.  The CT scan noted a 50% stenosis of the left ICA with no evidence of significant stenosis on the right ICA.  Based on this we do not recommend any intervention at this time but we will continue to follow.  Continue antiplatelet  therapy as prescribed Continue management of CAD, HTN and Hyperlipidemia Healthy heart diet,  encouraged exercise at least 4 times per week Follow up in 6 months with duplex ultrasound and physical exam    2. 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  She is currently codominant.  Typically intervention with vertebral arteries is difficult especially if they are less than 4 mm in size, and in fact intervention can sometimes cause more harm than benefit.  The patient is concerned due to having some significant dizziness, however her greatest concern is her inability to walk with her severe claudication.  Therefore we will proceed with intervention of her lower extremities and we can discuss possible angiogram for evaluation of her vertebral arteries at a follow-up date.  3. Atherosclerosis of native artery of both lower extremities with intermittent claudication (HCC) Recommend:  The patient has experienced increased claudication symptoms and is now describing lifestyle limiting claudication and appears to be having mild rest pain symptroms.  Given the severity of the patient's severe left lower extremity symptoms the patient should undergo angiography with the hope for intervention.  Risk and benefits were reviewed the patient.  Indications for the procedure were reviewed.  All questions were answered, the patient agrees to proceed with left lower extremity angiography and possible intervention.   The patient should  continue walking and begin a more formal exercise program.  The patient should continue antiplatelet therapy and aggressive treatment of the lipid abnormalities  The patient will follow up with me after the angiogram.    4. Primary hypertension Continue antihypertensive medications as already ordered, these medications have been reviewed and there are no changes at this time.   5. Hyperlipidemia LDL goal <70 Continue statin as ordered and reviewed, no changes at this time   6. Tobacco use The patient has stopped smoking.  Continued cessation is encouraged   Current Outpatient Medications on File Prior to Visit  Medication Sig Dispense Refill  . aspirin 81 MG chewable tablet Chew 1 tablet (81 mg total) by mouth daily.    Marland Kitchen atorvastatin (LIPITOR) 80 MG tablet Take 1 tablet (80 mg total) by mouth daily. 30 tablet 12  . isosorbide mononitrate (IMDUR) 30 MG 24 hr tablet Take 1 tablet (30 mg total) by mouth daily. 30 tablet 11  . metoprolol succinate (TOPROL-XL) 50 MG 24 hr tablet Take 50 mg by mouth daily. Take with or immediately following a meal.    . nitroGLYCERIN (NITROSTAT) 0.4 MG SL tablet Place 1 tablet (0.4 mg total) under the tongue every 5 (five) minutes as needed for chest pain. 25 tablet 3  . pantoprazole (PROTONIX) 40 MG tablet Take 1 tablet (40 mg total) by mouth 2 (two) times daily. 60 tablet 2  . ticagrelor (BRILINTA) 90 MG TABS tablet Take 1 tablet (90 mg total) by mouth 2 (two) times daily. 60 tablet 12   No current facility-administered medications on file prior to visit.    There are no Patient Instructions on file for this visit. No follow-ups on file.   [place]iana Spinner, NP

## 2021-12-09 ENCOUNTER — Telehealth (INDEPENDENT_AMBULATORY_CARE_PROVIDER_SITE_OTHER): Payer: Self-pay | Admitting: Vascular Surgery

## 2021-12-09 NOTE — Telephone Encounter (Signed)
Joycelyn Schmid called in reference to patient stating she do not authorize procedures but if there is anything she can do to help with patient's case, please call her at [phone], Ext. [phone].

## 2021-12-20 ENCOUNTER — Telehealth (INDEPENDENT_AMBULATORY_CARE_PROVIDER_SITE_OTHER): Payer: Self-pay

## 2021-12-20 NOTE — Telephone Encounter (Signed)
Spoke with the patient and she is scheduled with Dr. Delana Meyer for a LLE angio on 12/21/21 with a 12:00 pm arrival time to the [facility]. Pre-procedure instructions were discussed and patient stated she understood.

## 2021-12-21 ENCOUNTER — Ambulatory Visit
Admission: RE | Admit: 2021-12-21 | Discharge: 2021-12-21 | Disposition: A | Discharge status: Home / Self Care | Payer: Commercial Managed Care - HMO | Attending: Vascular Surgery | Admitting: Vascular Surgery

## 2021-12-21 ENCOUNTER — Encounter: Payer: Self-pay | Admitting: Vascular Surgery

## 2021-12-21 ENCOUNTER — Encounter
Admission: RE | Disposition: A | Discharge status: Home / Self Care | Payer: Self-pay | Source: Home / Self Care | Attending: Vascular Surgery

## 2021-12-21 ENCOUNTER — Ambulatory Visit: Payer: Commercial Managed Care - HMO

## 2021-12-21 DIAGNOSIS — I70213 Atherosclerosis of native arteries of extremities with intermittent claudication, bilateral legs: Secondary | ICD-10-CM | POA: Diagnosis present

## 2021-12-21 DIAGNOSIS — I252 Old myocardial infarction: Secondary | ICD-10-CM | POA: Diagnosis not present

## 2021-12-21 DIAGNOSIS — I6522 Occlusion and stenosis of left carotid artery: Secondary | ICD-10-CM | POA: Diagnosis not present

## 2021-12-21 DIAGNOSIS — I2510 Atherosclerotic heart disease of native coronary artery without angina pectoris: Secondary | ICD-10-CM | POA: Diagnosis not present

## 2021-12-21 DIAGNOSIS — Z87891 Personal history of nicotine dependence: Secondary | ICD-10-CM | POA: Insufficient documentation

## 2021-12-21 DIAGNOSIS — I6503 Occlusion and stenosis of bilateral vertebral arteries: Secondary | ICD-10-CM | POA: Insufficient documentation

## 2021-12-21 DIAGNOSIS — I10 Essential (primary) hypertension: Secondary | ICD-10-CM | POA: Insufficient documentation

## 2021-12-21 DIAGNOSIS — I70219 Atherosclerosis of native arteries of extremities with intermittent claudication, unspecified extremity: Secondary | ICD-10-CM

## 2021-12-21 DIAGNOSIS — I700 Atherosclerosis of aorta: Secondary | ICD-10-CM

## 2021-12-21 DIAGNOSIS — E785 Hyperlipidemia, unspecified: Secondary | ICD-10-CM | POA: Insufficient documentation

## 2021-12-21 HISTORY — PX: LOWER EXTREMITY ANGIOGRAPHY: CATH118251

## 2021-12-21 LAB — CREATININE, SERUM
Creatinine, Ser: 0.89 mg/dL (ref 0.44–1.00)
GFR, Estimated: >60 mL/min (ref >60)

## 2021-12-21 LAB — BUN: BUN: 15 mg/dL (ref 6–20)

## 2021-12-21 SURGERY — LOWER EXTREMITY ANGIOGRAPHY
Anesthesia: Moderate Sedation | Site: Leg Lower | Laterality: Left

## 2021-12-21 MED ORDER — FENTANYL CITRATE (PF) 100 MCG/2ML IJ SOLN
INTRAMUSCULAR | Status: DC | PRN
  Administered 2021-12-21: 25 ug via INTRAVENOUS
  Administered 2021-12-21: 50 ug via INTRAVENOUS
  Administered 2021-12-21: 25 ug via INTRAVENOUS

## 2021-12-21 MED ORDER — OXYCODONE HCL 5 MG PO TABS: 5.0000 mg | ORAL_TABLET | ORAL | Status: DC | PRN

## 2021-12-21 MED ORDER — MORPHINE SULFATE (PF) 2 MG/ML IV SOLN
INTRAVENOUS | Status: AC
  Filled 2021-12-21: qty 1

## 2021-12-21 MED ORDER — SODIUM CHLORIDE 0.9 % IV SOLN: INTRAVENOUS | Status: AC

## 2021-12-21 MED ORDER — HYDRALAZINE HCL 20 MG/ML IJ SOLN: 5.0000 mg | INTRAMUSCULAR | Status: DC | PRN

## 2021-12-21 MED ORDER — HEPARIN SODIUM (PORCINE) 1000 UNIT/ML IJ SOLN
INTRAMUSCULAR | Status: AC
  Filled 2021-12-21: qty 10

## 2021-12-21 MED ORDER — ONDANSETRON HCL 4 MG/2ML IJ SOLN: 4.0000 mg | Freq: Four times a day (QID) | INTRAMUSCULAR | Status: DC | PRN

## 2021-12-21 MED ORDER — FENTANYL CITRATE (PF) 100 MCG/2ML IJ SOLN
INTRAMUSCULAR | Status: AC
  Filled 2021-12-21: qty 2

## 2021-12-21 MED ORDER — MIDAZOLAM HCL 5 MG/5ML IJ SOLN
INTRAMUSCULAR | Status: AC
  Filled 2021-12-21: qty 5

## 2021-12-21 MED ORDER — SODIUM CHLORIDE 0.9 % IV SOLN: 250.0000 mL | INTRAVENOUS | Status: DC | PRN

## 2021-12-21 MED ORDER — HYDRALAZINE HCL 20 MG/ML IJ SOLN: 10.0000 mg | INTRAMUSCULAR | Status: DC | PRN

## 2021-12-21 MED ORDER — ACETAMINOPHEN 325 MG PO TABS
650.0000 mg | ORAL_TABLET | ORAL | Status: DC | PRN
Start: 2021-12-21 — End: 2021-12-23

## 2021-12-21 MED ORDER — CEFAZOLIN SODIUM-DEXTROSE 2-4 GM/100ML-% IV SOLN
2.0000 g | INTRAVENOUS | Status: AC
  Administered 2021-12-21: 2 g via INTRAVENOUS
  Filled 2021-12-21 (×2): qty 100

## 2021-12-21 MED ORDER — LABETALOL HCL 5 MG/ML IV SOLN
INTRAVENOUS | Status: AC
  Administered 2021-12-21: 10 mg via INTRAVENOUS
  Filled 2021-12-21: qty 4

## 2021-12-21 MED ORDER — ONDANSETRON HCL 4 MG/2ML IJ SOLN
INTRAMUSCULAR | Status: AC
  Administered 2021-12-21: 4 mg via INTRAVENOUS
  Filled 2021-12-21: qty 2

## 2021-12-21 MED ORDER — DIPHENHYDRAMINE HCL 50 MG/ML IJ SOLN: 50.0000 mg | Freq: Once | INTRAMUSCULAR | Status: DC | PRN

## 2021-12-21 MED ORDER — HYDRALAZINE HCL 20 MG/ML IJ SOLN
INTRAMUSCULAR | Status: DC | PRN
  Administered 2021-12-21: 10 mg via INTRAVENOUS

## 2021-12-21 MED ORDER — MORPHINE SULFATE (PF) 4 MG/ML IV SOLN
2.0000 mg | INTRAVENOUS | Status: DC | PRN
  Administered 2021-12-21 (×2): 1 mg via INTRAVENOUS

## 2021-12-21 MED ORDER — MIDAZOLAM HCL 2 MG/2ML IJ SOLN
INTRAMUSCULAR | Status: DC | PRN
  Administered 2021-12-21 (×2): 1 mg via INTRAVENOUS
  Administered 2021-12-21 (×2): .5 mg via INTRAVENOUS

## 2021-12-21 MED ORDER — HEPARIN SODIUM (PORCINE) 1000 UNIT/ML IJ SOLN
INTRAMUSCULAR | Status: DC | PRN
  Administered 2021-12-21: 5000 [IU] via INTRAVENOUS

## 2021-12-21 MED ORDER — SODIUM CHLORIDE 0.9% FLUSH
3.0000 mL | Freq: Two times a day (BID) | INTRAVENOUS | Status: DC
Start: 2021-12-21 — End: 2021-12-23

## 2021-12-21 MED ORDER — FAMOTIDINE 20 MG PO TABS: 40.0000 mg | ORAL_TABLET | Freq: Once | ORAL | Status: DC | PRN

## 2021-12-21 MED ORDER — HYDRALAZINE HCL 20 MG/ML IJ SOLN
INTRAMUSCULAR | Status: AC
  Filled 2021-12-21: qty 1

## 2021-12-21 MED ORDER — MIDAZOLAM HCL 2 MG/ML PO SYRP: 8.0000 mg | ORAL_SOLUTION | Freq: Once | ORAL | Status: DC | PRN

## 2021-12-21 MED ORDER — HYDROMORPHONE HCL 1 MG/ML IJ SOLN: 1.0000 mg | Freq: Once | INTRAMUSCULAR | Status: DC | PRN

## 2021-12-21 MED ORDER — SODIUM CHLORIDE 0.9% FLUSH: 3.0000 mL | INTRAVENOUS | Status: DC | PRN

## 2021-12-21 MED ORDER — NITROGLYCERIN 1 MG/10 ML FOR IR/CATH LAB
INTRA_ARTERIAL | Status: DC | PRN
  Administered 2021-12-21: 500 ug via INTRA_ARTERIAL

## 2021-12-21 MED ORDER — METHYLPREDNISOLONE SODIUM SUCC 125 MG IJ SOLR: 125.0000 mg | Freq: Once | INTRAMUSCULAR | Status: DC | PRN

## 2021-12-21 MED ORDER — LABETALOL HCL 5 MG/ML IV SOLN
10.0000 mg | INTRAVENOUS | Status: DC | PRN
  Administered 2021-12-21: 10 mg via INTRAVENOUS

## 2021-12-21 MED ORDER — SODIUM CHLORIDE 0.9 % IV SOLN: INTRAVENOUS | Status: DC

## 2021-12-21 SURGICAL SUPPLY — 22 items
BALLN LUTONIX AV 8X40X75 (BALLOONS) ×2
BALLN LUTONIX DCB 6X40X130 (BALLOONS) ×4
BALLOON LUTONIX AV 8X40X75 (BALLOONS) IMPLANT
BALLOON LUTONIX DCB 6X40X130 (BALLOONS) IMPLANT
CATH ANGIO 5F PIGTAIL 65CM (CATHETERS) ×1 IMPLANT
COVER PROBE U/S 5X48 (MISCELLANEOUS) ×2 IMPLANT
DEVICE STARCLOSE SE CLOSURE (Vascular Products) ×2 IMPLANT
GLIDESHEATH SLENDER 7FR .021G (SHEATH) ×2 IMPLANT
GLIDEWIRE ADV .035X260CM (WIRE) ×1 IMPLANT
GUIDEWIRE VERSACORE 260 (WIRE) ×1 IMPLANT
KIT ENCORE 26 ADVANTAGE (KITS) ×2 IMPLANT
NDL ENTRY 21GA 7CM ECHOTIP (NEEDLE) IMPLANT
NEEDLE ENTRY 21GA 7CM ECHOTIP (NEEDLE) ×2 IMPLANT
PACK ANGIOGRAPHY (CUSTOM PROCEDURE TRAY) ×2 IMPLANT
SET INTRO CAPELLA COAXIAL (SET/KITS/TRAYS/PACK) ×1 IMPLANT
SHEATH BRITE TIP 5FRX11 (SHEATH) ×1 IMPLANT
STENT LIFESTAR 7X40X80 (Permanent Stent) ×1 IMPLANT
STENT LIFESTREAM 7X37X80 (Permanent Stent) ×1 IMPLANT
STENT LIFESTREAM 7X58X80 (Permanent Stent) ×2 IMPLANT
SYR MEDRAD MARK 7 150ML (SYRINGE) ×1 IMPLANT
TUBING CONTRAST HIGH PRESS 72 (TUBING) ×1 IMPLANT
WIRE GUIDERIGHT .035X150 (WIRE) ×1 IMPLANT

## 2021-12-21 NOTE — Op Note (Signed)
[facility]  Percutaneous Study/Intervention Procedural Note   Date of Surgery: 12/21/2021  Surgeon:Emett Stapel, Dolores Lory   Pre-operative Diagnosis: Atherosclerotic occlusive disease bilateral lower extremities with lifestyle limiting claudication left leg much worse than the right  Post-operative diagnosis:  Same  Procedure(s) Performed:  1.  Abdominal aortogram  2.  Left lower extremity distal runoff  3.  Percutaneous transluminal angioplasty and stent placement right common iliac artery; "kissing balloon" technique  4.  Percutaneous transluminal and plasty and stent placement left common iliac artery; "kissing balloon" technique  5.  Percutaneous transluminal angioplasty and stent placement right external iliac artery             6.  Ultrasound guided access bilateral common femoral arteries  7.  StarClose closure device bilateral common femoral arteries  Anesthesia: Conscious sedation was administered under my direct supervision by the interventional radiology RN. IV Versed plus fentanyl were utilized. Continuous ECG, pulse oximetry and blood pressure was monitored throughout the entire procedure. Conscious sedation was for a total of 61 minutes.  Sheath: Bilateral 23 cm 7 French Pinnacle sheaths retrograde common femoral arteries  Contrast: 75 cc  Fluoroscopy Time: 6.1 minutes  Indications: Patient presents with increasing pain of both lower extremities.  Physical examination as well as noninvasive studies demonstrate significant atherosclerotic occlusive disease.  Angiography with hope for intervention has been recommended.  Risks and benefits have been reviewed all questions have been answered alternative therapies have also been discussed patient has agreed to proceed.  Procedure:  JANAE BONSER a 61 y.o. female who was identified and appropriate procedural time out was performed.  The patient was then placed supine on the table and prepped and draped  in the usual sterile fashion.  Ultrasound was used to evaluate the right common femoral artery.  It was echolucent and pulsatile indicating it is patent .  An ultrasound image was acquired for the permanent record.  A micropuncture needle was used to access the right common femoral artery under direct ultrasound guidance.  The microwire was then advanced under fluoroscopic guidance without difficulty followed by the micro-sheath  A 0.035 J wire was advanced without resistance and a 5Fr sheath was placed.    Initially the J-wire would not advance through the right common iliac artery into the aorta.  Hand-injection of contrast was then performed through the right femoral sheath and this demonstrated severe atherosclerotic occlusive disease of both common iliac arteries just distal to the aortic bifurcation.  I then advanced an advantage wire up into the aorta under fluoroscopic guidance.  The pigtail catheter was then positioned at the level of T12 and an AP image of the aorta was obtained. After review the images the pigtail catheter was repositioned above the aortic bifurcation and bilateral oblique views of the pelvis were obtained.   After review the pelvic images the ultrasound was reprepped and delivered back onto the sterile field. The left common femoral was then imaged with the ultrasound it was noted to be echolucent and pulsatile indicating patency. Images recorded for the permanent record. Under real-time visualization a microneedle was inserted into the anterior wall the common femoral artery microwire was then advanced without difficulty under fluoroscopic guidance followed by placement of the micro-sheath.  A versa core wire was then negotiated under fluoroscopic guidance into the aorta.  7 French slim sheath was then placed.  5000 units of heparin was given and allowed to circulate for proximally 4 minutes.  The right 5 [place]  sheath was then upsized to a 7 [place] slim sheath as well after a  advantage wire was advanced through the pigtail catheter. Magnified images of the aortic bifurcation were then made using hand injection contrast from the femoral sheaths. After appropriate sizing a 7 mm mm x 58 mm lifestream stent was selected for the right and a 7 mm x 58 mm lifestream stent was selected for the left. There were then advanced and positioned just above the aortic bifurcation. Insufflation for full expansion of the stents was performed simultaneously.   Hand-injection through the right femoral sheath then demonstrated good apposition of the stents proximally with full expansion distally the right common iliac stent was not well opposed to the wall of the common iliac artery.  Hand-injection also demonstrated greater than 80% stenosis within the proximal right external iliac artery.  This lesion extended over approximately 20 mm.  I selected a 7 mm x 40 mm life star stent deployed across the external iliac lesion and then postdilated this with a 6 mm Lutonix drug-eluting balloon.  Later in the case an 8 mm x 40 mm Lutonix drug-eluting balloon was used to post dilate the distal common iliac stent on the right so that the stent now fully opposed to wall.  The left side was then reimaged in the LAO projection and the origin of the internal iliac artery was localized.  A one 7 mm x 37 mm lifestream stent was then added to completely cover the common iliac artery on the right given the tandem greater than 70% stenoses in the distal common iliac.  Internal iliac artery although is much smaller than on the left than the right was preserved.  Left external iliac artery was free of hemodynamically significant disease.  Hand-injection of contrast through the left sheath was then used to create distal runoff.  A total of 400 mcg of intra arterial nitroglycerin was administered on the left side.  Oblique views were then obtained of the groins in succession and Star close device is deployed without  difficulty. There were no immediate complications   Findings:   Aortogram:  The abdominal aorta is opacified with a bolus injection contrast. Demonstrates diffuse disease but there are no hemodynamically significant lesions noted until the distal aortic bifurcation where bilateral hemodynamically significant lesions are noted in the common iliac arteries.  On the right it is approximately 70 to 75% with moderate poststenotic dilatation distally on the left it is greater than 90% extending over several centimeters and then there are tandem greater than 70% stenoses in the distal left common iliac.  The internal iliac artery on the right is widely patent internal iliac on the left is patent but is quite small.  There is a greater than 75% stenosis in the proximal right external iliac artery.  The left external iliac artery is free of hemodynamically significant stenosis.    Right Lower Extremity: Common iliac and visualized portions of the SFA and profunda are diffusely diseased but widely patent  Left Lower Extremity: The left common femoral appears to have moderate posterior plaque but this does not appear to be hemodynamically significant.  The origin of the profunda femoris demonstrates a greater than 80% stenosis with moderate poststenotic dilatation distally it appears widely patent.  The origin of the SFA is widely patent and the SFA is patent filling the popliteal and the trifurcation.  Tibial vessels are patent down to the ankle.  There not appear to be any hemodynamically significant lesions noted  in the superficial femoral-popliteal tibial distribution.  Following placement of the common iliac stents there is now wide patency with less than 10% residual stenosis with rapid flow through the aortic bifurcation bilaterally.  Both internal iliac arteries are preserved.  Following angioplasty and stent placement to the right external iliac artery there is now less than 10% residual stenosis.  Summary:   Successful reconstruction of the distal aorta and bilateral iliac arteries  Disposition: Patient was taken to the recovery room in stable condition having tolerated the procedure well.  Hortencia Pilar 12/21/2021,3:21 PM

## 2021-12-21 NOTE — OR Nursing (Signed)
1458, pt reporting nausea/vomiting, Zofran IV given. Short term bleeding right groin secondary to vomiting. Manual hold 5 minutes, redressed with gauze and Tegaderm dressing. Dr Delana Meyer aware. Reproting nause resolved 5 mintues post

## 2021-12-21 NOTE — Interval H&P Note (Signed)
History and Physical Interval Note:  12/21/2021 12:22 PM  Carrie Butler  has presented today for surgery, with the diagnosis of LLE Angio  BARD    ASO w claudication.  The various methods of treatment have been discussed with the patient and family. After consideration of risks, benefits and other options for treatment, the patient has consented to  Procedure(s): Lower Extremity Angiography (Left) as a surgical intervention.  The patient's history has been reviewed, patient examined, no change in status, stable for surgery.  I have reviewed the patient's chart and labs.  Questions were answered to the patient's satisfaction.     Hortencia Pilar

## 2021-12-21 NOTE — OR Nursing (Signed)
1526 pt oozing left groin dressing. Manual hold 10 minutes, vascular lab and MD notified. Injected with lidocaine and PAD with 45 ml air applied. Slow ooze noted prior to PAD. Pt reports inability to void. Md notified. 16 french in and out cath done. 500 ml clear urine removed.

## 2021-12-21 NOTE — OR Nursing (Signed)
1700 Pt sat up to eat. She waited to take home BP meds but instructed to hold Brilenta until morning due to continuous track ooze. At 1720 Pt reported significant increase in bilateral groin pain. Palpation of right produced jumping and guarding of site.left side uncomfortable to palpation but not as reactive as right groin palpation. Dr Lucky Cowboy assessed groins and discussed options to rule out retroperitoneal bleed with patient. since she lives 45 minutes away for patient safetly CT ordered to rule out bleed.CT completed. No evidence of retroperitoneal bleed. Pt remains hypertensive. Pt said that it is normal. Groin sites both continue to have slow intermittant ooze. Pt on Brilenta. Dr Lucky Cowboy suspects ooze is from that. Pressure tape applied over bilateral groin sites. Pt insisting that she just needs to go home. Discussed with Dr Lucky Cowboy, significant other and patient. Pt reports that she will be better off at home. She was given dressing material and stretch tape to change her dressings as needed at home. . Pt reports pain improved after dose of morphine. Monitored 30 minutes prior to discharge. Remains hypertensive. Pt reports she will take Marijuana to deal with her pain and hypertension. Md Aware.Pt transferred to car with her Significant other.

## 2021-12-22 ENCOUNTER — Encounter: Payer: Self-pay | Admitting: Vascular Surgery

## 2021-12-22 NOTE — TOC Initial Note (Signed)
Transition of Care [facility]) - Initial/Assessment Note    Patient Details  Name: TANYIKA BARROS MRN: [id number] Date of Birth: [birth year]  Transition of Care [facility]) CM/SW Contact:    Conception Oms, RN Phone Number: 12/22/2021, 10:24 AM  Clinical Narrative:                   Transition of Care [facility]) Screening Note   Patient Details  Name: LASHARA UREY Date of Birth: 1961/01/10   Transition of Care [facility]) CM/SW Contact:    Conception Oms, RN Phone Number: 12/22/2021, 10:24 AM    Transition of Care Department [facility]) has reviewed patient and no TOC needs have been identified at this time. We will continue to monitor patient advancement through interdisciplinary progression rounds. If new patient transition needs arise, please place a TOC consult.         Patient Goals and CMS Choice        Expected Discharge Plan and Services           Expected Discharge Date: 12/21/21                                    Prior Living Arrangements/Services                       Activities of Daily Living Home Assistive Devices/Equipment: Eyeglasses ADL Screening (condition at time of admission) Patient's cognitive ability adequate to safely complete daily activities?: Yes Is the patient deaf or have difficulty hearing?: No Does the patient have difficulty seeing, even when wearing glasses/contacts?: No Does the patient have difficulty concentrating, remembering, or making decisions?: No Patient able to express need for assistance with ADLs?: Yes Does the patient have difficulty dressing or bathing?: No Independently performs ADLs?: Yes (appropriate for developmental age) Does the patient have difficulty walking or climbing stairs?: Yes Weakness of Legs: Both Weakness of Arms/Hands: None  Permission Sought/Granted                  Emotional Assessment              Admission diagnosis:  LLE Angio  BARD    ASO w claudication Patient Active  Problem List   Diagnosis Date Noted   Hypokalemia 06/10/2021   Dysphagia 06/10/2021   Coronary artery disease involving native coronary artery of native heart with unstable angina pectoris ([place]) 06/09/2021   Presence of drug coated stent in right coronary artery 06/09/2021   Leukocytosis 06/09/2021   Non-STEMI (non-ST elevated myocardial infarction) ([place]) 06/08/2021   NSTEMI (non-ST elevated myocardial infarction) ([place]) 06/07/2021   Left carotid artery stenosis 06/05/2021   ST elevation myocardial infarction (STEMI) of inferior wall, subsequent episode of care ([place]) 06/03/2021   Tobacco use 06/03/2021   Primary hypertension 06/03/2021   Hyperlipidemia LDL goal <70 06/03/2021   PCP:  Pcp, No Pharmacy:   [id number] [place] [id number]: [phone]: [address] [place]Phone: [phone]: [phone]    Social Determinants of Health (SDOH) Interventions    Readmission Risk Interventions     No data to display

## 2022-01-12 ENCOUNTER — Emergency Department (HOSPITAL_COMMUNITY): Payer: Commercial Managed Care - HMO

## 2022-01-12 ENCOUNTER — Emergency Department (HOSPITAL_COMMUNITY)
Admission: EM | Admit: 2022-01-12 | Discharge: 2022-01-12 | Disposition: A | Discharge status: Home / Self Care | Payer: Commercial Managed Care - HMO | Attending: Emergency Medicine | Admitting: Emergency Medicine

## 2022-01-12 ENCOUNTER — Other Ambulatory Visit: Payer: Self-pay

## 2022-01-12 ENCOUNTER — Encounter (HOSPITAL_COMMUNITY): Payer: Self-pay | Admitting: *Deleted

## 2022-01-12 DIAGNOSIS — S2221XA Fracture of manubrium, initial encounter for closed fracture: Secondary | ICD-10-CM | POA: Diagnosis not present

## 2022-01-12 DIAGNOSIS — Y92410 Unspecified street and highway as the place of occurrence of the external cause: Secondary | ICD-10-CM | POA: Diagnosis not present

## 2022-01-12 DIAGNOSIS — M25522 Pain in left elbow: Secondary | ICD-10-CM | POA: Insufficient documentation

## 2022-01-12 DIAGNOSIS — S0990XA Unspecified injury of head, initial encounter: Secondary | ICD-10-CM | POA: Insufficient documentation

## 2022-01-12 DIAGNOSIS — S3991XA Unspecified injury of abdomen, initial encounter: Secondary | ICD-10-CM | POA: Insufficient documentation

## 2022-01-12 DIAGNOSIS — Z7982 Long term (current) use of aspirin: Secondary | ICD-10-CM | POA: Diagnosis not present

## 2022-01-12 DIAGNOSIS — S299XXA Unspecified injury of thorax, initial encounter: Secondary | ICD-10-CM | POA: Diagnosis present

## 2022-01-12 DIAGNOSIS — M25421 Effusion, right elbow: Secondary | ICD-10-CM

## 2022-01-12 DIAGNOSIS — Z91040 Latex allergy status: Secondary | ICD-10-CM | POA: Diagnosis not present

## 2022-01-12 LAB — COMPREHENSIVE METABOLIC PANEL
ALT: 36 U/L (ref 0–44)
AST: 46 U/L — ABNORMAL HIGH (ref 15–41)
Albumin: 3.2 g/dL — ABNORMAL LOW (ref 3.5–5.0)
Alkaline Phosphatase: 76 U/L (ref 38–126)
Anion gap: 7 (ref 5–15)
BUN: 13 mg/dL (ref 6–20)
CO2: 25 mmol/L (ref 22–32)
Calcium: 9.1 mg/dL (ref 8.9–10.3)
Chloride: 107 mmol/L (ref 98–111)
Creatinine, Ser: 0.93 mg/dL (ref 0.44–1.00)
GFR, Estimated: >60 mL/min (ref >60)
Glucose, Bld: 124 mg/dL — ABNORMAL HIGH (ref 70–99)
Potassium: 3.3 mmol/L — ABNORMAL LOW (ref 3.5–5.1)
Sodium: 139 mmol/L (ref 135–145)
Total Bilirubin: 0.6 mg/dL (ref 0.3–1.2)
Total Protein: 6.8 g/dL (ref 6.5–8.1)

## 2022-01-12 LAB — CBC
HCT: 33.7 % — ABNORMAL LOW (ref 36.0–46.0)
Hemoglobin: 10.9 g/dL — ABNORMAL LOW (ref 12.0–15.0)
MCH: 28.4 pg (ref 26.0–34.0)
MCHC: 32.3 g/dL (ref 30.0–36.0)
MCV: 87.8 fL (ref 80.0–100.0)
Platelets: 422 10*3/uL — ABNORMAL HIGH (ref 150–400)
RBC: 3.84 MIL/uL — ABNORMAL LOW (ref 3.87–5.11)
RDW: 15.9 % — ABNORMAL HIGH (ref 11.5–15.5)
WBC: 9.9 10*3/uL (ref 4.0–10.5)
nRBC: 0 % (ref 0.0–0.2)

## 2022-01-12 LAB — URINALYSIS, ROUTINE W REFLEX MICROSCOPIC
Bilirubin Urine: NEGATIVE
Glucose, UA: NEGATIVE mg/dL
Ketones, ur: NEGATIVE mg/dL
Leukocytes,Ua: NEGATIVE
Nitrite: NEGATIVE
Protein, ur: NEGATIVE mg/dL
Specific Gravity, Urine: 1.009 (ref 1.005–1.030)
pH: 7 (ref 5.0–8.0)

## 2022-01-12 LAB — I-STAT CHEM 8, ED
BUN: 14 mg/dL (ref 6–20)
Calcium, Ion: 1.18 mmol/L (ref 1.15–1.40)
Chloride: 105 mmol/L (ref 98–111)
Creatinine, Ser: 0.8 mg/dL (ref 0.44–1.00)
Glucose, Bld: 118 mg/dL — ABNORMAL HIGH (ref 70–99)
HCT: 35 % — ABNORMAL LOW (ref 36.0–46.0)
Hemoglobin: 11.9 g/dL — ABNORMAL LOW (ref 12.0–15.0)
Potassium: 3.4 mmol/L — ABNORMAL LOW (ref 3.5–5.1)
Sodium: 142 mmol/L (ref 135–145)
TCO2: 23 mmol/L (ref 22–32)

## 2022-01-12 LAB — PROTIME-INR
INR: 1 (ref 0.8–1.2)
Prothrombin Time: 13 seconds (ref 11.4–15.2)

## 2022-01-12 LAB — LACTIC ACID, PLASMA: Lactic Acid, Venous: 2.4 mmol/L (ref 0.5–1.9)

## 2022-01-12 LAB — CK TOTAL AND CKMB (NOT AT ARMC)
CK, MB: 6.9 ng/mL — ABNORMAL HIGH (ref 0.5–5.0)
Relative Index: 4.5 — ABNORMAL HIGH (ref 0.0–2.5)
Total CK: 155 U/L (ref 38–234)

## 2022-01-12 LAB — SAMPLE TO BLOOD BANK

## 2022-01-12 LAB — ETHANOL: Alcohol, Ethyl (B): <10 mg/dL (ref <10)

## 2022-01-12 MED ORDER — FENTANYL CITRATE PF 50 MCG/ML IJ SOSY
50.0000 ug | PREFILLED_SYRINGE | INTRAMUSCULAR | Status: DC | PRN
  Administered 2022-01-12 (×2): 50 ug via INTRAVENOUS
  Filled 2022-01-12 (×2): qty 1

## 2022-01-12 MED ORDER — SODIUM CHLORIDE 0.9 % IV BOLUS
500.0000 mL | Freq: Once | INTRAVENOUS | Status: AC
  Administered 2022-01-12: 500 mL via INTRAVENOUS

## 2022-01-12 MED ORDER — HYDROCODONE-ACETAMINOPHEN 5-325 MG PO TABS
1.0000 | ORAL_TABLET | Freq: Once | ORAL | Status: AC
  Administered 2022-01-12: 1 via ORAL
  Filled 2022-01-12: qty 1

## 2022-01-12 MED ORDER — IOHEXOL 300 MG/ML  SOLN
70.0000 mL | Freq: Once | INTRAMUSCULAR | Status: AC | PRN
  Administered 2022-01-12: 70 mL via INTRAVENOUS

## 2022-01-12 MED ORDER — HYDROCODONE-ACETAMINOPHEN 5-325 MG PO TABS: 1.0000 | ORAL_TABLET | Freq: Four times a day (QID) | ORAL | 0 refills | Status: DC | PRN

## 2022-01-12 MED ORDER — SODIUM CHLORIDE 0.9 % IV BOLUS
125.0000 mL | Freq: Once | INTRAVENOUS | Status: AC
  Administered 2022-01-12: 125 mL via INTRAVENOUS

## 2022-01-12 NOTE — ED Notes (Signed)
Pt anxious, c/o sob chest heaviness, VSS, LS CTA, EKG reassuring, pt reassured, orders received and initiated.

## 2022-01-12 NOTE — ED Notes (Signed)
EDP at BS 

## 2022-01-12 NOTE — ED Notes (Signed)
Ambulatory to b/r, steady gait, out with belongings in w/c, by RN to car, feels better, denies questions or needs, VSS,

## 2022-01-12 NOTE — ED Notes (Signed)
Pt to CT

## 2022-01-12 NOTE — ED Notes (Signed)
Remains in radiology.

## 2022-01-12 NOTE — ED Notes (Signed)
Xray at BS 
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
## 2022-01-12 NOTE — ED Notes (Signed)
Family/ d/c'd pt at [facility]

## 2022-01-12 NOTE — ED Notes (Signed)
Got patient undressed into a gown on the monitor did ekg shown to Dr Vallery Ridge  patient is resting with call bell in reach

## 2022-01-12 NOTE — ED Notes (Signed)
Pt took an extra dose of her own metoprolol 50 mg extended release.  Blood pressures are elevated but she reports this is typical in the situation and she will continue to monitor.  She does not have any signs of endorgan damage per EDP

## 2022-01-12 NOTE — Progress Notes (Signed)
Orthopedic Tech Progress Note Patient Details:  Carrie Butler Aug 08, 1960 [id number]  Ortho Devices Type of Ortho Device: Arm sling Ortho Device/Splint Location: RUE Ortho Device/Splint Interventions: Ordered, Application, Adjustment   Post Interventions Patient Tolerated: Well Instructions Provided: Adjustment of device, Care of device, Poper ambulation with device  [name] 01/12/2022, 3:55 PM

## 2022-01-12 NOTE — ED Notes (Signed)
Wlaked patient to the bathroom patient did well

## 2022-01-12 NOTE — Discharge Instructions (Signed)
1.  You have swelling of the elbow.  The x-ray is not showing a break right now but this is a common way to get a radial head fracture of the elbow.  See the attached instructions and description.  Try to elevate the elbow is much as possible.  Wear your sling.  Schedule follow-up with [facility] as soon as possible for recheck and monitoring. 2.  You have a sternal fracture.  This is nondisplaced.  This can be very painful.  Your CT scan did not show any damage to underlying organs at this time.  Try to keep pain controlled and follow-up with your family doctor for recheck. 3.  You may take 1-2 Vicodin tablets every 6 hours for pain control.  Maximum Tylenol dose in 24 hours is 4000 mg.  You may take combination of 1 Tylenol tablet and 1 Vicodin tablet every 6 hours. If this does not control your pain, you may take 2 Vicodin tablets every 6 hours.  Be careful not to exceed the daily maximum for use of Tylenol. 4.  Return to the emergency department if you have new worsening or concerning symptoms

## 2022-01-12 NOTE — ED Notes (Signed)
Pt to radiology.

## 2022-01-12 NOTE — ED Notes (Signed)
IS explained and demonstrated.

## 2022-01-25 ENCOUNTER — Other Ambulatory Visit (INDEPENDENT_AMBULATORY_CARE_PROVIDER_SITE_OTHER): Payer: Self-pay | Admitting: Vascular Surgery

## 2022-01-25 DIAGNOSIS — I70213 Atherosclerosis of native arteries of extremities with intermittent claudication, bilateral legs: Secondary | ICD-10-CM

## 2022-01-25 DIAGNOSIS — Z95820 Peripheral vascular angioplasty status with implants and grafts: Secondary | ICD-10-CM

## 2022-01-26 ENCOUNTER — Ambulatory Visit (INDEPENDENT_AMBULATORY_CARE_PROVIDER_SITE_OTHER): Payer: Commercial Managed Care - HMO | Admitting: Nurse Practitioner

## 2022-01-26 ENCOUNTER — Encounter (INDEPENDENT_AMBULATORY_CARE_PROVIDER_SITE_OTHER): Payer: Self-pay | Admitting: Nurse Practitioner

## 2022-01-26 ENCOUNTER — Ambulatory Visit (INDEPENDENT_AMBULATORY_CARE_PROVIDER_SITE_OTHER): Payer: Commercial Managed Care - HMO

## 2022-01-26 VITALS — BP 221/110 | HR 58 | Resp 17 | Ht <= 58 in | Wt 84.0 lb

## 2022-01-26 DIAGNOSIS — I70213 Atherosclerosis of native arteries of extremities with intermittent claudication, bilateral legs: Secondary | ICD-10-CM

## 2022-01-26 DIAGNOSIS — Z95820 Peripheral vascular angioplasty status with implants and grafts: Secondary | ICD-10-CM | POA: Diagnosis not present

## 2022-01-26 DIAGNOSIS — E785 Hyperlipidemia, unspecified: Secondary | ICD-10-CM | POA: Diagnosis not present

## 2022-01-26 DIAGNOSIS — I6522 Occlusion and stenosis of left carotid artery: Secondary | ICD-10-CM

## 2022-01-26 DIAGNOSIS — Z720 Tobacco use: Secondary | ICD-10-CM | POA: Diagnosis not present

## 2022-02-10 ENCOUNTER — Encounter: Payer: Self-pay | Admitting: Cardiology

## 2022-02-10 ENCOUNTER — Ambulatory Visit: Payer: Commercial Managed Care - HMO | Attending: Cardiology | Admitting: Cardiology

## 2022-02-10 VITALS — BP 186/88 | HR 76 | Ht <= 58 in | Wt 86.4 lb

## 2022-02-10 DIAGNOSIS — E7841 Elevated Lipoprotein(a): Secondary | ICD-10-CM | POA: Diagnosis not present

## 2022-02-10 DIAGNOSIS — I25118 Atherosclerotic heart disease of native coronary artery with other forms of angina pectoris: Secondary | ICD-10-CM | POA: Diagnosis not present

## 2022-02-10 DIAGNOSIS — E785 Hyperlipidemia, unspecified: Secondary | ICD-10-CM | POA: Diagnosis not present

## 2022-02-10 DIAGNOSIS — I10 Essential (primary) hypertension: Secondary | ICD-10-CM

## 2022-02-10 MED ORDER — CLOPIDOGREL BISULFATE 75 MG PO TABS: 75.0000 mg | ORAL_TABLET | Freq: Every day | ORAL | 3 refills | Status: DC

## 2022-02-10 NOTE — Patient Instructions (Addendum)
Medication Instructions:  Your physician has recommended you make the following change in your medication:   START: Clopidogrel 75 mg daily on 06/07/22 and stop Aspirin and Brilinta  *If you need a refill on your cardiac medications before your next appointment, please call your pharmacy*   Lab Work: Your physician recommends that you return for lab work in:   Labs today: Lipid  If you have labs (blood work) drawn today and your tests are completely normal, you will receive your results only by: [employer] (if you have [place]) OR A paper copy in the mail If you have any lab test that is abnormal or we need to change your treatment, we will call you to review the results.   Testing/Procedures: None   Follow-Up: At [facility], you and your health needs are our priority.  As part of our continuing mission to provide you with exceptional heart care, we have created designated Provider Care Teams.  These Care Teams include your primary Cardiologist (physician) and Advanced Practice Providers (APPs -  Physician Assistants and Nurse Practitioners) who all work together to provide you with the care you need, when you need it.  We recommend signing up for the patient portal called "MyChart".  Sign up information is provided on this After Visit Summary.  MyChart is used to connect with patients for Virtual Visits (Telemedicine).  Patients are able to view lab/test results, encounter notes, upcoming appointments, etc.  Non-urgent messages can be sent to your provider as well.   To learn more about what you can do with MyChart, go to [email or web address].    Your next appointment:   6 month(s)  The format for your next appointment:   In Person  Provider:   Shirlee More, MD    Other Instructions None  Important Information About Sugar
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
## 2022-02-11 ENCOUNTER — Telehealth: Payer: Self-pay

## 2022-02-11 DIAGNOSIS — E785 Hyperlipidemia, unspecified: Secondary | ICD-10-CM

## 2022-02-11 LAB — LIPID PANEL
Chol/HDL Ratio: 2.4 ratio (ref 0.0–4.4)
Cholesterol, Total: 219 mg/dL — ABNORMAL HIGH (ref 100–199)
HDL: 90 mg/dL (ref >39)
LDL Chol Calc (NIH): 108 mg/dL — ABNORMAL HIGH (ref 0–99)
Triglycerides: 122 mg/dL (ref 0–149)
VLDL Cholesterol Cal: 21 mg/dL (ref 5–40)

## 2022-02-11 MED ORDER — EZETIMIBE 10 MG PO TABS
10.0000 mg | ORAL_TABLET | Freq: Every day | ORAL | 0 refills | Status: DC
Start: 2022-02-11 — End: 2022-05-11

## 2022-02-11 NOTE — Telephone Encounter (Signed)
-----   Message from Richardo Priest, MD sent at 02/11/2022  7:30 AM EDT ----- LDL is significantly elevated I would like her to add Zetia 10 mg daily to take along with her statin and I think she will achieve the goal LDL in the range of 50-70 recheck lipids 2 months

## 2022-02-11 NOTE — Telephone Encounter (Signed)
Patient aware of results and recommendations and agreed with plan.  Med sent, Lab order on file

## 2022-02-13 ENCOUNTER — Encounter (INDEPENDENT_AMBULATORY_CARE_PROVIDER_SITE_OTHER): Payer: Self-pay | Admitting: Nurse Practitioner
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
## 2022-04-11 ENCOUNTER — Encounter (INDEPENDENT_AMBULATORY_CARE_PROVIDER_SITE_OTHER): Payer: Self-pay

## 2022-04-25 ENCOUNTER — Telehealth: Payer: Self-pay | Admitting: Cardiology

## 2022-04-25 NOTE — Telephone Encounter (Signed)
Patient stated she has a very bad cold and would like to know what she can take for this as she is on blood thinners.

## 2022-04-26 NOTE — Telephone Encounter (Signed)
Call to inform - safe cough and cold medication for her to take is Coricidin HBP cough and cold as this is decongestant free formulation which does not affect her BP and does not have significant drug interaction with her current medications.

## 2022-05-04 ENCOUNTER — Other Ambulatory Visit (INDEPENDENT_AMBULATORY_CARE_PROVIDER_SITE_OTHER): Payer: Self-pay | Admitting: Nurse Practitioner

## 2022-05-04 DIAGNOSIS — I739 Peripheral vascular disease, unspecified: Secondary | ICD-10-CM

## 2022-05-04 DIAGNOSIS — I6522 Occlusion and stenosis of left carotid artery: Secondary | ICD-10-CM

## 2022-05-08 DIAGNOSIS — I70219 Atherosclerosis of native arteries of extremities with intermittent claudication, unspecified extremity: Secondary | ICD-10-CM | POA: Insufficient documentation

## 2022-05-08 HISTORY — DX: Atherosclerosis of native arteries of extremities with intermittent claudication, unspecified extremity: I70.219
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
## 2022-05-09 ENCOUNTER — Encounter (INDEPENDENT_AMBULATORY_CARE_PROVIDER_SITE_OTHER): Payer: Commercial Managed Care - HMO

## 2022-05-09 ENCOUNTER — Ambulatory Visit (INDEPENDENT_AMBULATORY_CARE_PROVIDER_SITE_OTHER): Payer: Commercial Managed Care - HMO | Admitting: Vascular Surgery

## 2022-05-09 DIAGNOSIS — I10 Essential (primary) hypertension: Secondary | ICD-10-CM

## 2022-05-09 DIAGNOSIS — E785 Hyperlipidemia, unspecified: Secondary | ICD-10-CM

## 2022-05-09 DIAGNOSIS — I25110 Atherosclerotic heart disease of native coronary artery with unstable angina pectoris: Secondary | ICD-10-CM

## 2022-05-09 DIAGNOSIS — I70213 Atherosclerosis of native arteries of extremities with intermittent claudication, bilateral legs: Secondary | ICD-10-CM

## 2022-05-09 DIAGNOSIS — I6522 Occlusion and stenosis of left carotid artery: Secondary | ICD-10-CM

## 2022-05-11 ENCOUNTER — Other Ambulatory Visit: Payer: Self-pay | Admitting: Cardiology

## 2022-05-11 NOTE — Telephone Encounter (Signed)
Rx refill sent to pharmacy. 

## 2022-06-10 ENCOUNTER — Other Ambulatory Visit: Payer: Self-pay | Admitting: Physician Assistant

## 2022-06-16 ENCOUNTER — Other Ambulatory Visit: Payer: Self-pay | Admitting: Cardiology

## 2022-07-08 ENCOUNTER — Telehealth: Payer: Self-pay | Admitting: Cardiology

## 2022-07-08 ENCOUNTER — Telehealth: Payer: Self-pay | Admitting: Internal Medicine

## 2022-07-08 DIAGNOSIS — K219 Gastro-esophageal reflux disease without esophagitis: Secondary | ICD-10-CM

## 2022-07-08 MED ORDER — PANTOPRAZOLE SODIUM 40 MG PO TBEC: 40.0000 mg | DELAYED_RELEASE_TABLET | Freq: Two times a day (BID) | ORAL | 1 refills | Status: DC

## 2022-07-08 NOTE — Telephone Encounter (Signed)
Rx for Linzess sent to pharmacy as requested.  Patient made aware that she needs to follow up appointment.  Patient advised that she has been scheduled with Dr Lorenso Courier on 07-14-22 at 9:30am.  Patient agreed to plan and verbalized understanding.  No further questions.

## 2022-07-08 NOTE — Telephone Encounter (Signed)
Patient is calling to have a RX for protonix sent to pharmacy. Please advise.

## 2022-07-08 NOTE — Telephone Encounter (Signed)
Patient stated when she went to pick up her medications, she was told there was a note in her record that stated Dr. Bettina Gavia request lab work. The patient wanted clarification if she does need lab work prior to her next appointment. Will route this to Dr. Bettina Gavia and his nurse.

## 2022-07-08 NOTE — Telephone Encounter (Signed)
Patient called stating her pharmacy told her Dr. Bettina Gavia wanted her to come in to have labs done. She was calling to find out if that was true.  Please advise.

## 2022-07-11 NOTE — Telephone Encounter (Signed)
Left message for the patient to call back.  

## 2022-07-11 NOTE — Telephone Encounter (Signed)
There are two message threads for this patient. I will delete this one and continue to chart on the other thread.

## 2022-07-13 NOTE — Telephone Encounter (Signed)
Left message for the patient to call back.  

## 2022-07-14 ENCOUNTER — Other Ambulatory Visit: Payer: Self-pay

## 2022-07-14 ENCOUNTER — Ambulatory Visit: Payer: Commercial Managed Care - HMO | Admitting: Internal Medicine

## 2022-07-14 DIAGNOSIS — E785 Hyperlipidemia, unspecified: Secondary | ICD-10-CM

## 2022-07-14 NOTE — Telephone Encounter (Signed)
Called patient and informed her of Dr. Oren Binet recommendation below:  "Reviewing her chart it looks like there was a communication last September that she was to have had a lipid profile performed I assume that is what this call was about it says that there are orders on file for this to be performed.  If not CMP and lipid profile."  Patient was agreeable with this recommendation and orders for lab work were entered in [employer]. Patient had no further questions at this time.

## 2022-07-26 ENCOUNTER — Other Ambulatory Visit: Payer: Self-pay

## 2022-07-26 LAB — LIPID PANEL
Chol/HDL Ratio: 2.2 ratio (ref 0.0–4.4)
Cholesterol, Total: 162 mg/dL (ref 100–199)
HDL: 75 mg/dL (ref >39)
LDL Chol Calc (NIH): 67 mg/dL (ref 0–99)
Triglycerides: 113 mg/dL (ref 0–149)
VLDL Cholesterol Cal: 20 mg/dL (ref 5–40)

## 2022-07-26 LAB — COMPREHENSIVE METABOLIC PANEL
ALT: 12 IU/L (ref 0–32)
AST: 23 IU/L (ref 0–40)
Albumin/Globulin Ratio: 1.7 (ref 1.2–2.2)
Albumin: 4.3 g/dL (ref 3.8–4.9)
Alkaline Phosphatase: 95 IU/L (ref 44–121)
BUN/Creatinine Ratio: 14 (ref 12–28)
BUN: 12 mg/dL (ref 8–27)
Bilirubin Total: <0.2 mg/dL (ref 0.0–1.2)
CO2: 23 mmol/L (ref 20–29)
Calcium: 9.6 mg/dL (ref 8.7–10.3)
Chloride: 104 mmol/L (ref 96–106)
Creatinine, Ser: 0.86 mg/dL (ref 0.57–1.00)
Globulin, Total: 2.6 g/dL (ref 1.5–4.5)
Glucose: 109 mg/dL — ABNORMAL HIGH (ref 70–99)
Potassium: 4.4 mmol/L (ref 3.5–5.2)
Sodium: 142 mmol/L (ref 134–144)
Total Protein: 6.9 g/dL (ref 6.0–8.5)
eGFR: 77 mL/min/{1.73_m2} (ref >59)

## 2022-07-26 MED ORDER — NITROGLYCERIN 0.4 MG SL SUBL: 0.4000 mg | SUBLINGUAL_TABLET | SUBLINGUAL | 3 refills | Status: AC | PRN

## 2022-07-26 MED ORDER — NITROGLYCERIN 0.4 MG SL SUBL: 0.4000 mg | SUBLINGUAL_TABLET | SUBLINGUAL | 3 refills | Status: DC | PRN

## 2022-08-11 ENCOUNTER — Other Ambulatory Visit: Payer: Self-pay | Admitting: Cardiology

## 2022-08-11 ENCOUNTER — Telehealth: Payer: Self-pay | Admitting: Pharmacy Technician

## 2022-08-11 NOTE — Telephone Encounter (Signed)
Patient Advocate Encounter  Received notification from [place] that prior authorization for PANTOPRAZOLE is required.   PA submitted on 2.29.24 Key [id number] Status is pending

## 2022-08-29 NOTE — Telephone Encounter (Signed)
PA has been APPROVED from 08/12/2022-08/10/2023

## 2022-10-07 ENCOUNTER — Other Ambulatory Visit: Payer: Self-pay | Admitting: Cardiology

## 2022-10-11 ENCOUNTER — Telehealth: Payer: Self-pay | Admitting: Internal Medicine

## 2022-10-11 DIAGNOSIS — K219 Gastro-esophageal reflux disease without esophagitis: Secondary | ICD-10-CM

## 2022-10-11 MED ORDER — PANTOPRAZOLE SODIUM 40 MG PO TBEC
40.0000 mg | DELAYED_RELEASE_TABLET | Freq: Two times a day (BID) | ORAL | 3 refills | Status: DC
Start: 2022-10-11 — End: 2023-01-20

## 2022-10-11 NOTE — Telephone Encounter (Signed)
Patient called in to follow-up on Medication refill "protonix".She stated that she is been waiting for over a month and she can't wait any longer.Please advise .  Thank you

## 2022-10-11 NOTE — Telephone Encounter (Signed)
Left message on voicemail that Rx for pantoprazole has been sent to [facility] as requested.

## 2023-01-04 ENCOUNTER — Other Ambulatory Visit: Payer: Self-pay

## 2023-01-04 MED ORDER — ATORVASTATIN CALCIUM 80 MG PO TABS: 80.0000 mg | ORAL_TABLET | Freq: Every day | ORAL | 0 refills | Status: DC

## 2023-01-05 ENCOUNTER — Other Ambulatory Visit: Payer: Self-pay | Admitting: Cardiology

## 2023-01-05 MED ORDER — EZETIMIBE 10 MG PO TABS: 10.0000 mg | ORAL_TABLET | Freq: Every day | ORAL | 0 refills | Status: DC

## 2023-01-05 MED ORDER — METOPROLOL SUCCINATE ER 50 MG PO TB24: 50.0000 mg | ORAL_TABLET | Freq: Every day | ORAL | 2 refills | Status: DC

## 2023-01-05 MED ORDER — CLOPIDOGREL BISULFATE 75 MG PO TABS: 75.0000 mg | ORAL_TABLET | Freq: Every day | ORAL | 3 refills | Status: DC

## 2023-01-20 ENCOUNTER — Other Ambulatory Visit: Payer: Self-pay | Admitting: Cardiology

## 2023-01-20 ENCOUNTER — Telehealth: Payer: Self-pay | Admitting: Cardiology

## 2023-01-20 ENCOUNTER — Other Ambulatory Visit: Payer: Self-pay

## 2023-01-20 DIAGNOSIS — K219 Gastro-esophageal reflux disease without esophagitis: Secondary | ICD-10-CM

## 2023-01-20 MED ORDER — EZETIMIBE 10 MG PO TABS: 10.0000 mg | ORAL_TABLET | Freq: Every day | ORAL | 0 refills | Status: DC

## 2023-01-20 MED ORDER — PANTOPRAZOLE SODIUM 40 MG PO TBEC
40.0000 mg | DELAYED_RELEASE_TABLET | Freq: Two times a day (BID) | ORAL | 0 refills | Status: DC
Start: 2023-01-20 — End: 2023-10-13

## 2023-01-20 MED ORDER — CLOPIDOGREL BISULFATE 75 MG PO TABS: 75.0000 mg | ORAL_TABLET | Freq: Every day | ORAL | 0 refills | Status: DC

## 2023-01-20 MED ORDER — METOPROLOL SUCCINATE ER 50 MG PO TB24: 50.0000 mg | ORAL_TABLET | Freq: Every day | ORAL | 0 refills | Status: DC

## 2023-01-20 NOTE — Telephone Encounter (Signed)
Refill on Ezetimibe 10mg 

## 2023-01-20 NOTE — Telephone Encounter (Signed)
Refill on Plavix 75mg 

## 2023-01-20 NOTE — Telephone Encounter (Signed)
Refill on Metoprolol 50mg 

## 2023-02-15 ENCOUNTER — Telehealth: Payer: Self-pay

## 2023-02-15 NOTE — Telephone Encounter (Signed)
Called patient to schedule a follow up visit per Dr Leonides Schanz. No answer left message on voice mail to call office back

## 2023-04-20 IMAGING — CR DG CHEST 2V
2 series · 2 of 2 positions shown · non-contrast
Comparison: 06/07/2021

CLINICAL DATA: Right-sided rhonchi on exam

EXAM:
CHEST - 2 VIEW

[chest lat]
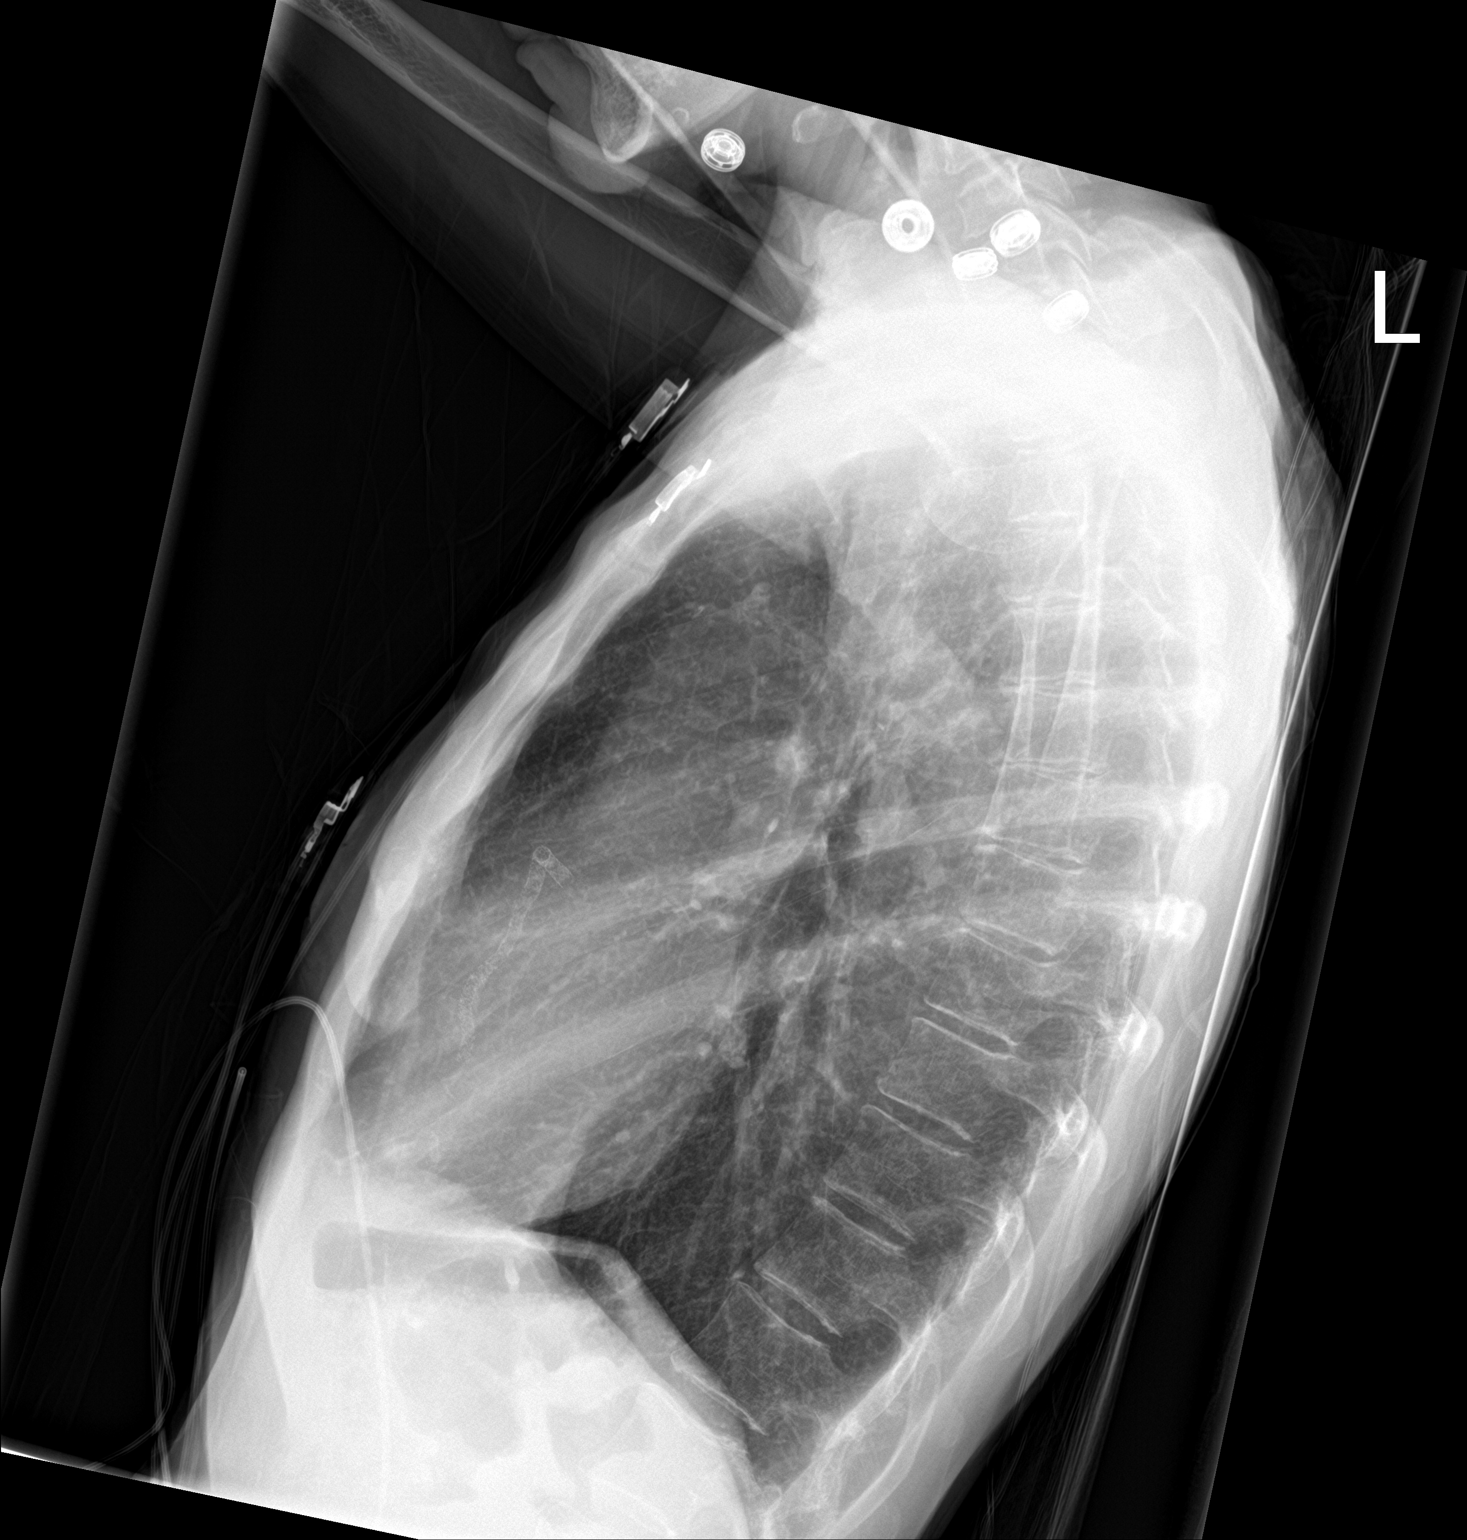

[chest ap]
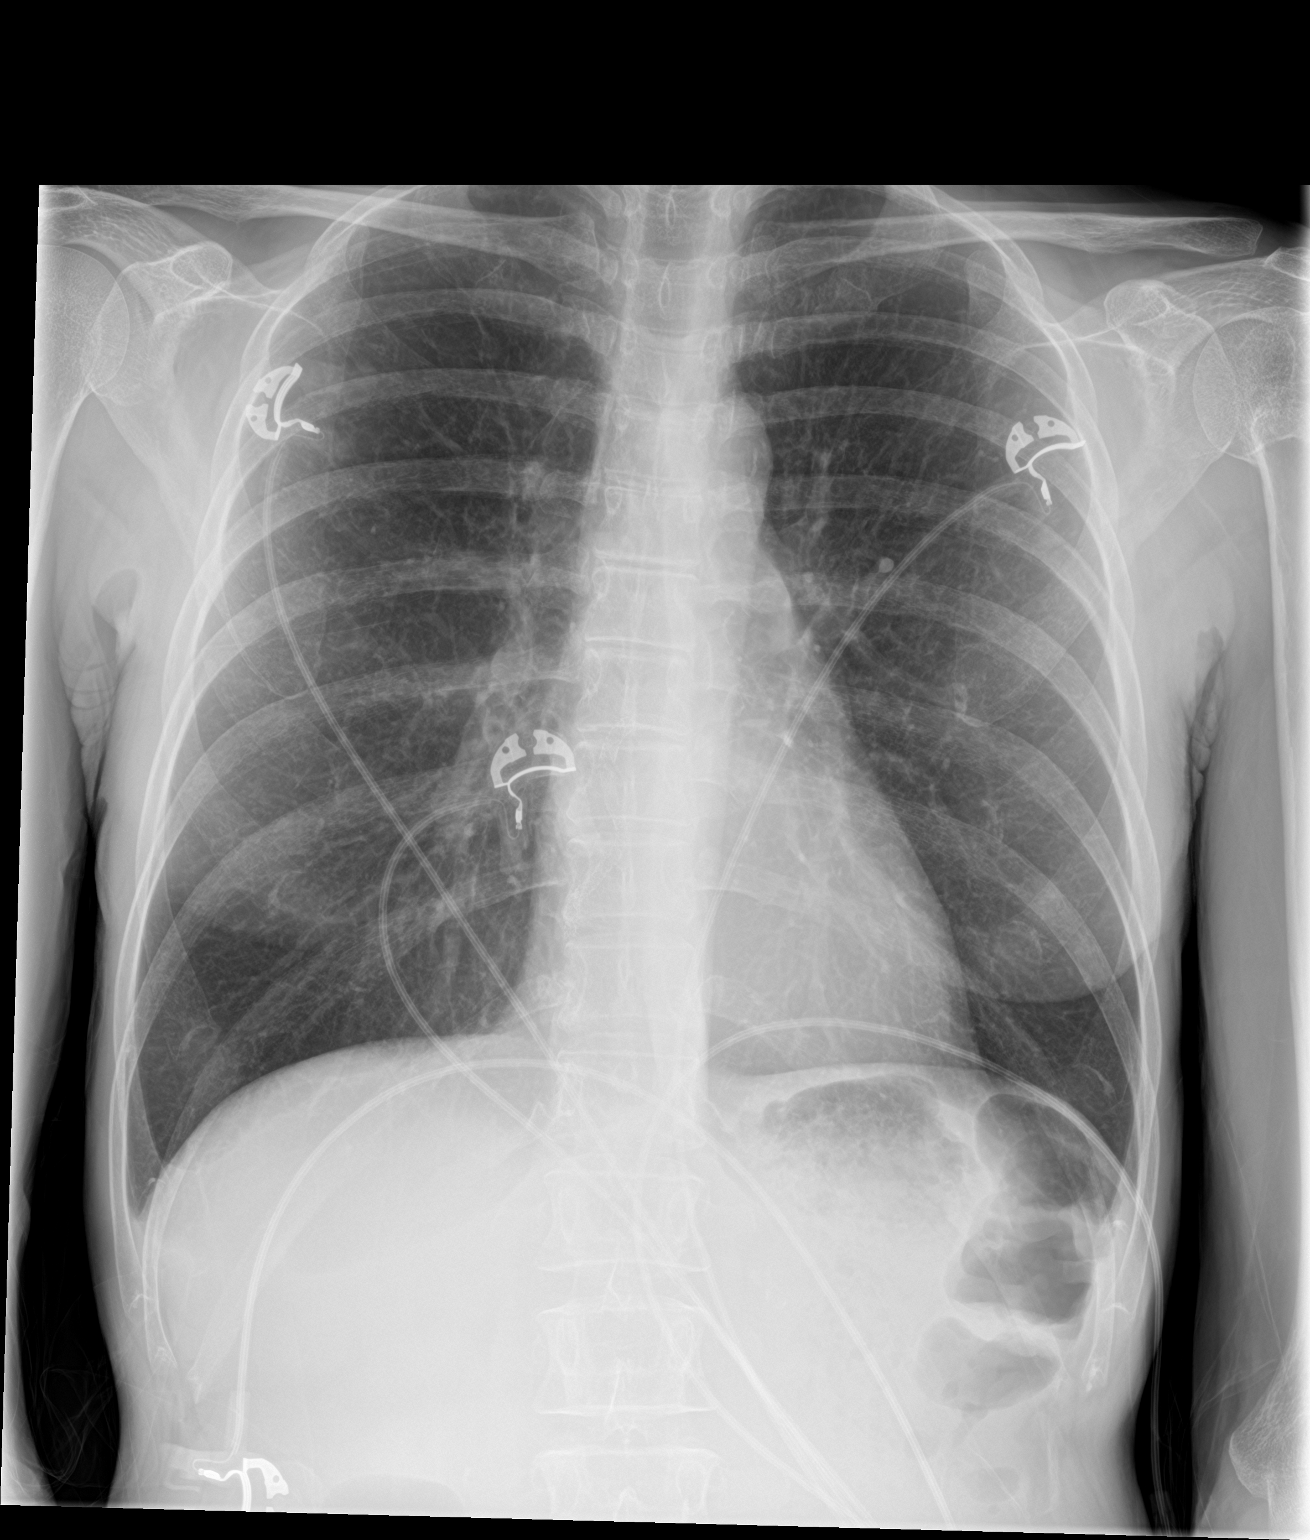

[2 of 2 positions shown; findings below may reference images not displayed]

FINDINGS: The heart size and mediastinal contours are within normal limits.
Both lungs are clear. Probable chronic fracture deformity at the
left humeral neck.
IMPRESSION: No active cardiopulmonary disease.

## 2023-05-16 ENCOUNTER — Other Ambulatory Visit: Payer: Self-pay | Admitting: Cardiology

## 2023-05-16 MED ORDER — METOPROLOL SUCCINATE ER 50 MG PO TB24: 50.0000 mg | ORAL_TABLET | Freq: Every day | ORAL | 0 refills | Status: DC

## 2023-05-16 MED ORDER — CLOPIDOGREL BISULFATE 75 MG PO TABS: 75.0000 mg | ORAL_TABLET | Freq: Every day | ORAL | 0 refills | Status: DC

## 2023-05-16 MED ORDER — EZETIMIBE 10 MG PO TABS: 10.0000 mg | ORAL_TABLET | Freq: Every day | ORAL | 0 refills | Status: DC

## 2023-05-22 NOTE — Progress Notes (Unsigned)
Cardiology Office Note:    Date:  05/22/2023   ID:  Carrie Butler, DOB [birth year], MRN [id number]  PCP:  Pcp, No  Cardiologist:  Norman Herrlich, MD    Referring MD: No ref. provider found    ASSESSMENT:    1. 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 Hyperlipidemia LDL goal <70   3. Elevated lipoprotein(a)   4. Primary hypertension    PLAN:    In order of problems listed above:  ***   Next appointment: ***   Medication Adjustments/Labs and Tests Ordered: Current medicines are reviewed at length with the patient today.  Concerns regarding medicines are outlined above.  No orders of the defined types were placed in this encounter.  No orders of the defined types were placed in this encounter.    History of Present Illness:    Carrie Butler is a 62 y.o. female with a hx of CAD with ST elevation myocardial infarction PCI and 3 stents requiring artery 06/09/2022 she had residual stenosis ostial left circumflex felt not to be amenable to PCI hypertensive heart disease and moderate LVH normal ejection fraction hyperlipidemia and elevated lipoprotein a last seen 02/11/2023. Compliance with diet, lifestyle and medications: *** Past Medical History:  Diagnosis Date   Coronary artery disease    Coronary artery disease involving native coronary artery of native heart with unstable angina pectoris (HCC) 06/09/2021   Cardiac Cath-PCI 06/03/2021: Mid RCA 100% occlusion - ostial to distal mid RCA PCI with 3 overlapping DES stents (distal 2.5 x 30, mid 3.0 x 18, ostial-proximal 3.5 x 22 Onyx Frontier DES) but unable to pass stent to the distal 70% stenosis (very angulated).  Also noted ostial LCx 70 to 80% and OM1 mid 70% (recommended medical therapy).. Cardiac Cath 06/07/2021: mid-distal LM 30%-< Ost LCx ~80% (s   Hyperlipidemia    Hypertension    Presence of drug coated stent in right coronary artery  06/09/2021   Cardiac Cath-PCI 06/03/2021: (Inferior STEMI) mid RCA 100% occlusion - ostial to distal mid RCA PCI with 3 overlapping DES stents (distal 2.5 x 30, mid 3.0 x 18, ostial-proximal 3.5 x 22 Onyx Frontier DES) but unable to pass stent to the distal 70% stenosis (very angulated).    Current Medications: No outpatient medications have been marked as taking for the 05/23/23 encounter (Appointment) with Baldo Daub, MD.      EKGs/Labs/Other Studies Reviewed:    The following studies were reviewed today:  Cardiac Studies & Procedures   CARDIAC CATHETERIZATION  CARDIAC CATHETERIZATION 06/07/2021  Narrative   Mid LM to Dist LM lesion is 30% stenosed.   Ost LAD to Prox LAD lesion is 40% stenosed.   Prox LAD to Mid LAD lesion is 30% stenosed.   Mid Cx lesion is 65% stenosed.   Prox RCA to Mid RCA lesion is 30% stenosed.   Mid RCA lesion is 70% stenosed.   Dist RCA lesion is 50% stenosed.   1st Mrg lesion is 70% stenosed.   Ost Cx lesion is 80% stenosed.   Non-stenotic Ost RCA lesion was previously treated.   LV end diastolic pressure is normal.  Continued patency of the stents in the RCA. There is mild tissue prolapse in the mid stent but this is not flow limiting. There is no change in the 70% stenosis distal to the stent in a very tortuous segment. The patient does have moderate to  severe disease at the ostium of the LCx that may be the source of her symptoms. This is not amenable to PCI given trifurcation. Low LVEDP  Plan: optimize medical therapy. Increase Toprol XL dose and add oral nitrates. Check Echo.  Findings Coronary Findings Diagnostic  Dominance: Right  Left Main Mid LM to Dist LM lesion is 30% stenosed.  Left Anterior Descending Ost LAD to Prox LAD lesion is 40% stenosed. Prox LAD to Mid LAD lesion is 30% stenosed.  Left Circumflex Ost Cx lesion is 80% stenosed. Mid Cx lesion is 65% stenosed.  First Obtuse Marginal Branch 1st Mrg lesion is 70%  stenosed.  Right Coronary Artery Non-stenotic Ost RCA lesion was previously treated. Prox RCA to Mid RCA lesion is 30% stenosed. The lesion was previously treated . Mid RCA lesion is 70% stenosed. The lesion was previously treated . Dist RCA lesion is 50% stenosed. The lesion was previously treated .  Intervention  No interventions have been documented.   CARDIAC CATHETERIZATION  CARDIAC CATHETERIZATION 06/03/2021  Narrative   A stent was successfully placed.   Post intervention, there is a 50% residual stenosis.   Post intervention, there is a 70% residual stenosis.  Acute inferior ST elevation MI due to total mid vessel occlusion Successful complicated procedure requiring guide catheter extension, double wire, and overlapping stenting from distal vessel to the ostium of RCA.  3 stents used.  Residual stenosis of approximately 30% in the mid vessel due to prolapse of tissue at site of stent overlap.  The ostial RCA stent overhangs into the aorta significantly.  Not likely to be able to selectively engage in the future.  Beyond the stented segment there is a angulated 50 to 70% stenosis. Ostial 70 to 80% circumflex.  First obtuse marginal 70% mid.  Mid circumflex 50%. Distal left main 20 to 30%. Ostial LAD 20 to 30%.  30% mid. Moderate inferobasal hypokinesis with EF 40%.  RECOMMENDATIONS:  Aggressive risk factor modification including smoking cessation, and encouraging the patient to use medications for control of blood pressure and cholesterol. High risk for recurrent acute ischemic events due to phenotype and conventional medical therapy refusal  Findings Coronary Findings Diagnostic  Dominance: Right  Left Main Mid LM to Dist LM lesion is 30% stenosed.  Left Anterior Descending Ost LAD to Prox LAD lesion is 40% stenosed. Prox LAD to Mid LAD lesion is 30% stenosed.  Left Circumflex Ost Cx lesion is 70% stenosed. Mid Cx lesion is 65% stenosed.  First Obtuse Marginal  Branch 1st Mrg lesion is 70% stenosed.  Right Coronary Artery Ost RCA lesion is 50% stenosed. Prox RCA to Mid RCA lesion is 100% stenosed. Mid RCA lesion is 70% stenosed. Dist RCA lesion is 50% stenosed.  Intervention  Ost RCA lesion Stent (Also treats lesions: Prox RCA to Mid RCA) A stent was successfully placed. Post-Intervention Lesion Assessment The intervention was successful. Pre-interventional TIMI flow is 0. Post-intervention TIMI flow is 3. There is a 0% residual stenosis post intervention.  Prox RCA to Mid RCA lesion Stent (Also treats lesions: Ost RCA) See details in Ost RCA lesion. Post-Intervention Lesion Assessment The intervention was successful. Pre-interventional TIMI flow is 0. Post-intervention TIMI flow is 3. No complications occurred at this lesion. There is a 30% residual stenosis post intervention.  Mid RCA lesion Post-Intervention Lesion Assessment The intervention was successful. Pre-interventional TIMI flow is 0. Post-intervention TIMI flow is 3. There is a 70% residual stenosis post intervention.  Dist RCA lesion Post-Intervention Lesion  Assessment The intervention was successful. Pre-interventional TIMI flow is 0. Post-intervention TIMI flow is 3. There is a 50% residual stenosis post intervention.     ECHOCARDIOGRAM  ECHOCARDIOGRAM COMPLETE 06/07/2021  Narrative ECHOCARDIOGRAM REPORT    Patient Name:   Carrie Butler [facility] Date of Exam: 06/07/2021 Medical Rec #:  [id number]         Height:       58.0 in Accession #:    [id number]        Weight:       73.2 lb Date of Birth:  [birth year]          BSA:          1.188 m Patient Age:    60 years          BP:           145/96 mmHg Patient Gender: F                 HR:           89 bpm. Exam Location:  Inpatient  Procedure: 2D Echo, Cardiac Doppler and Color Doppler  Indications:    Chest Pain  History:        Patient has no prior history of Echocardiogram examinations. Previous Myocardial  Infarction; Risk Factors:Hypertension and Dyslipidemia. Tobacco use, Stents x3.  Sonographer:    Mikki Harbor Referring Phys: [phone] HAO MENG  IMPRESSIONS   1. Left ventricular ejection fraction, by estimation, is 55 to 60%. The left ventricle has normal function. The left ventricle has no regional wall motion abnormalities. There is moderate left ventricular hypertrophy. Left ventricular diastolic parameters are consistent with Grade I diastolic dysfunction (impaired relaxation). 2. Right ventricular systolic function is normal. The right ventricular size is normal. 3. The mitral valve is normal in structure. No evidence of mitral valve regurgitation. No evidence of mitral stenosis. 4. The aortic valve is tricuspid. Aortic valve regurgitation is not visualized. Aortic valve sclerosis is present, with no evidence of aortic valve stenosis. 5. The inferior vena cava is normal in size with greater than 50% respiratory variability, suggesting right atrial pressure of 3 mmHg.  Comparison(s): No prior Echocardiogram.  FINDINGS Left Ventricle: Left ventricular ejection fraction, by estimation, is 55 to 60%. The left ventricle has normal function. The left ventricle has no regional wall motion abnormalities. The left ventricular internal cavity size was normal in size. There is moderate left ventricular hypertrophy. Left ventricular diastolic parameters are consistent with Grade I diastolic dysfunction (impaired relaxation).  Right Ventricle: The right ventricular size is normal. Right ventricular systolic function is normal.  Left Atrium: Left atrial size was normal in size.  Right Atrium: Right atrial size was normal in size.  Pericardium: There is no evidence of pericardial effusion.  Mitral Valve: The mitral valve is normal in structure. Mild mitral annular calcification. No evidence of mitral valve regurgitation. No evidence of mitral valve stenosis. MV peak gradient, 2.5 mmHg. The  mean mitral valve gradient is 1.0 mmHg.  Tricuspid Valve: The tricuspid valve is normal in structure. Tricuspid valve regurgitation is trivial. No evidence of tricuspid stenosis.  Aortic Valve: The aortic valve is tricuspid. Aortic valve regurgitation is not visualized. Aortic valve sclerosis is present, with no evidence of aortic valve stenosis. Aortic valve mean gradient measures 3.0 mmHg. Aortic valve peak gradient measures 5.3 mmHg. Aortic valve area, by VTI measures 1.83 cm.  Pulmonic Valve: The pulmonic valve was not well visualized. Pulmonic valve regurgitation is not visualized.  No evidence of pulmonic stenosis.  Aorta: The aortic root is normal in size and structure.  Venous: The inferior vena cava is normal in size with greater than 50% respiratory variability, suggesting right atrial pressure of 3 mmHg.  IAS/Shunts: No atrial level shunt detected by color flow Doppler.   LEFT VENTRICLE PLAX 2D LVIDd:         2.90 cm     Diastology LVIDs:         2.00 cm     LV e' medial:    4.56 cm/s LV PW:         1.00 cm     LV E/e' medial:  13.9 LV IVS:        1.00 cm     LV e' lateral:   6.66 cm/s LVOT diam:     1.80 cm     LV E/e' lateral: 9.5 LV SV:         38 LV SV Index:   32 LVOT Area:     2.54 cm  LV Volumes (MOD) LV vol d, MOD A2C: 20.5 ml LV vol d, MOD A4C: 22.2 ml LV vol s, MOD A2C: 9.7 ml LV vol s, MOD A4C: 11.1 ml LV SV MOD A2C:     10.8 ml LV SV MOD A4C:     22.2 ml LV SV MOD BP:      11.4 ml  LEFT ATRIUM           Index        RIGHT ATRIUM          Index LA diam:      1.80 cm 1.52 cm/m   RA Area:     5.56 cm LA Vol (A4C): 12.9 ml 10.86 ml/m  RA Volume:   7.23 ml  6.09 ml/m AORTIC VALVE                    PULMONIC VALVE AV Area (Vmax):    2.23 cm     PV Vmax:       1.19 m/s AV Area (Vmean):   1.85 cm     PV Peak grad:  5.7 mmHg AV Area (VTI):     1.83 cm AV Vmax:           115.00 cm/s AV Vmean:          81.000 cm/s AV VTI:            0.207 m AV Peak  Grad:      5.3 mmHg AV Mean Grad:      3.0 mmHg LVOT Vmax:         101.00 cm/s LVOT Vmean:        59.000 cm/s LVOT VTI:          0.149 m LVOT/AV VTI ratio: 0.72  AORTA Ao Root diam: 2.80 cm  MITRAL VALVE MV Area (PHT): 2.38 cm    SHUNTS MV Area VTI:   1.67 cm    Systemic VTI:  0.15 m MV Peak grad:  2.5 mmHg    Systemic Diam: 1.80 cm MV Mean grad:  1.0 mmHg MV Vmax:       0.79 m/s MV Vmean:      43.7 cm/s MV Decel Time: 319 msec MV E velocity: 63.40 cm/s MV A velocity: 84.90 cm/s MV E/A ratio:  0.75  Olga Millers MD Electronically signed by Olga Millers MD Signature Date/Time: 06/07/2021/3:45:10 PM    Final  Recent Labs: 07/25/2022: ALT 12; BUN 12; Creatinine, Ser 0.86; Potassium 4.4; Sodium 142  Recent Lipid Panel    Component Value Date/Time   CHOL 162 07/25/2022 1157   TRIG 113 07/25/2022 1157   HDL 75 07/25/2022 1157   CHOLHDL 2.2 07/25/2022 1157   CHOLHDL 3.1 06/03/2021 1623   VLDL 10 06/03/2021 1623   LDLCALC 67 07/25/2022 1157    Physical Exam:    VS:  There were no vitals taken for this visit.    Wt Readings from Last 3 Encounters:  02/10/22 86 lb 6.4 oz (39.2 kg)  01/26/22 84 lb (38.1 kg)  01/12/22 85 lb (38.6 kg)     GEN: *** Well nourished, well developed in no acute distress HEENT: Normal NECK: No JVD; No carotid bruits LYMPHATICS: No lymphadenopathy CARDIAC: ***RRR, no murmurs, rubs, gallops RESPIRATORY:  Clear to auscultation without rales, wheezing or rhonchi  ABDOMEN: Soft, non-tender, non-distended MUSCULOSKELETAL:  No edema; No deformity  SKIN: Warm and dry NEUROLOGIC:  Alert and oriented x 3 PSYCHIATRIC:  Normal affect    Signed, Norman Herrlich, MD  05/22/2023 8:09 PM    [facility]

## 2023-05-23 ENCOUNTER — Ambulatory Visit: Payer: Commercial Managed Care - HMO | Attending: Cardiology | Admitting: Cardiology

## 2023-05-23 ENCOUNTER — Encounter: Payer: Self-pay | Admitting: Cardiology

## 2023-05-23 VITALS — BP 110/72 | HR 82 | Ht 58.5 in | Wt 93.0 lb

## 2023-05-23 DIAGNOSIS — E7841 Elevated Lipoprotein(a): Secondary | ICD-10-CM | POA: Diagnosis not present

## 2023-05-23 DIAGNOSIS — I10 Essential (primary) hypertension: Secondary | ICD-10-CM

## 2023-05-23 DIAGNOSIS — I25118 Atherosclerotic heart disease of native coronary artery with other forms of angina pectoris: Secondary | ICD-10-CM

## 2023-05-23 DIAGNOSIS — E785 Hyperlipidemia, unspecified: Secondary | ICD-10-CM

## 2023-05-23 MED ORDER — METOPROLOL SUCCINATE ER 50 MG PO TB24: 50.0000 mg | ORAL_TABLET | Freq: Every day | ORAL | 3 refills | Status: DC

## 2023-05-23 MED ORDER — METOPROLOL SUCCINATE ER 25 MG PO TB24: 25.0000 mg | ORAL_TABLET | Freq: Every day | ORAL | 3 refills | Status: DC

## 2023-05-23 NOTE — Patient Instructions (Addendum)
Medication Instructions:  Your physician has recommended you make the following change in your medication:   START: Toprol XL 25 mg daily  *If you need a refill on your cardiac medications before your next appointment, please call your pharmacy*   Lab Work: Your physician recommends that you return for lab work in:   Labs today: TSH T3 T4, CBC, Ferritin Iron TIBC  If you have labs (blood work) drawn today and your tests are completely normal, you will receive your results only by: MyChart Message (if you have MyChart) OR A paper copy in the mail If you have any lab test that is abnormal or we need to change your treatment, we will call you to review the results.   Testing/Procedures: None   Follow-Up: At [facility], you and your health needs are our priority.  As part of our continuing mission to provide you with exceptional heart care, we have created designated Provider Care Teams.  These Care Teams include your primary Cardiologist (physician) and Advanced Practice Providers (APPs -  Physician Assistants and Nurse Practitioners) who all work together to provide you with the care you need, when you need it.  We recommend signing up for the patient portal called "MyChart".  Sign up information is provided on this After Visit Summary.  MyChart is used to connect with patients for Virtual Visits (Telemedicine).  Patients are able to view lab/test results, encounter notes, upcoming appointments, etc.  Non-urgent messages can be sent to your provider as well.   To learn more about what you can do with MyChart, go to [email or web address].    Your next appointment:   1 year(s)  Provider:   Norman Herrlich, MD    Other Instructions None

## 2023-05-24 ENCOUNTER — Encounter: Payer: Self-pay | Admitting: Cardiology

## 2023-05-24 LAB — IRON,TIBC AND FERRITIN PANEL
Ferritin: 21 ng/mL (ref 15–150)
Iron Saturation: 15 % (ref 15–55)
Iron: 62 ug/dL (ref 27–159)
Total Iron Binding Capacity: 422 ug/dL (ref 250–450)
UIBC: 360 ug/dL (ref 131–425)

## 2023-05-24 LAB — CBC
Hematocrit: 41.8 % (ref 34.0–46.6)
Hemoglobin: 13.2 g/dL (ref 11.1–15.9)
MCH: 27.7 pg (ref 26.6–33.0)
MCHC: 31.6 g/dL (ref 31.5–35.7)
MCV: 88 fL (ref 79–97)
Platelets: 289 10*3/uL (ref 150–450)
RBC: 4.76 x10E6/uL (ref 3.77–5.28)
RDW: 14.8 % (ref 11.7–15.4)
WBC: 6.3 10*3/uL (ref 3.4–10.8)

## 2023-05-24 LAB — TSH+T4F+T3FREE
Free T4: 0.94 ng/dL (ref 0.82–1.77)
T3, Free: 3.2 pg/mL (ref 2.0–4.4)
TSH: 1.69 u[IU]/mL (ref 0.450–4.500)

## 2023-05-25 ENCOUNTER — Telehealth: Payer: Self-pay | Admitting: Cardiology

## 2023-05-25 NOTE — Telephone Encounter (Signed)
Patient called the office asking if it was ok for her to take Benadryl. Spoke to Dr. Dulce Sellar regarding the patient's request and he stated that it would be ok for her to take Benadryl. I relayed this information to the patient and she had no further questions at this time.

## 2023-05-25 NOTE — Telephone Encounter (Signed)
Patient is calling back for update. Would like someone to get her a call back today. Please advise

## 2023-05-25 NOTE — Telephone Encounter (Signed)
Patient calling in to see if it would be okay for her to take benadryl. Please advise

## 2023-05-27 ENCOUNTER — Other Ambulatory Visit: Payer: Self-pay | Admitting: Cardiology

## 2023-05-29 ENCOUNTER — Other Ambulatory Visit: Payer: Self-pay

## 2023-05-29 MED ORDER — ISOSORBIDE MONONITRATE ER 30 MG PO TB24: 30.0000 mg | ORAL_TABLET | Freq: Every day | ORAL | 2 refills | Status: DC

## 2023-06-27 ENCOUNTER — Telehealth: Payer: Self-pay | Admitting: Cardiology

## 2023-06-27 NOTE — Telephone Encounter (Signed)
 Pt c/o medication issue:  1. Name of Medication:   metoprolol  succinate (TOPROL  XL) 25 MG 24 hr tablet   2. How are you currently taking this medication (dosage and times per day)?     3. Are you having a reaction (difficulty breathing--STAT)?   4. What is your medication issue?   Patient stated she increased her dosage from  25 mg to 50 mg as it has not been keeping her BP low.  Patient stated she will need a new prescription for 50 mg sent to [facility] [place] [facility] [place],  - [address]

## 2023-06-28 MED ORDER — METOPROLOL SUCCINATE ER 50 MG PO TB24: 25.0000 mg | ORAL_TABLET | Freq: Every day | ORAL | 3 refills | Status: DC

## 2023-06-28 NOTE — Telephone Encounter (Signed)
 Refill sent.

## 2023-08-29 ENCOUNTER — Telehealth: Payer: Self-pay | Admitting: Cardiology

## 2023-08-29 DIAGNOSIS — E785 Hyperlipidemia, unspecified: Secondary | ICD-10-CM | POA: Insufficient documentation

## 2023-08-29 DIAGNOSIS — I2510 Atherosclerotic heart disease of native coronary artery without angina pectoris: Secondary | ICD-10-CM | POA: Insufficient documentation

## 2023-08-29 DIAGNOSIS — I10 Essential (primary) hypertension: Secondary | ICD-10-CM | POA: Insufficient documentation

## 2023-08-29 NOTE — Telephone Encounter (Signed)
-  New Message:       Patient says she would like to have an appointment to see Dr Dulce Sellar. She says when she stretch it feels like her heart is ripping apart.She says this same pain goes into her arm pit.

## 2023-08-29 NOTE — Telephone Encounter (Signed)
 Spoke with patient who requested to make an appointment for pain under her arm. Appointment was made and we discussed when to call 911. Pt states that the pain is in her axilla which is similar to the pain with her MI. Appointment has been changed to 3/19. Pt declined appt 3/18. Pt verbalized understanding and had no additional questions.

## 2023-08-30 ENCOUNTER — Other Ambulatory Visit: Payer: Self-pay | Admitting: Cardiology

## 2023-08-30 ENCOUNTER — Ambulatory Visit

## 2023-08-30 VITALS — BP 178/98 | HR 55 | Ht 58.6 in | Wt 96.4 lb

## 2023-08-30 DIAGNOSIS — I10 Essential (primary) hypertension: Secondary | ICD-10-CM | POA: Diagnosis not present

## 2023-08-30 DIAGNOSIS — I25118 Atherosclerotic heart disease of native coronary artery with other forms of angina pectoris: Secondary | ICD-10-CM

## 2023-08-30 DIAGNOSIS — R079 Chest pain, unspecified: Secondary | ICD-10-CM

## 2023-08-30 DIAGNOSIS — R55 Syncope and collapse: Secondary | ICD-10-CM | POA: Insufficient documentation

## 2023-08-30 HISTORY — DX: Chest pain, unspecified: R07.9

## 2023-08-30 HISTORY — DX: Syncope and collapse: R55

## 2023-08-30 MED ORDER — AMLODIPINE BESYLATE 5 MG PO TABS: 5.0000 mg | ORAL_TABLET | Freq: Every day | ORAL | 3 refills | Status: DC

## 2023-08-30 MED ORDER — CARVEDILOL 6.25 MG PO TABS: 6.2500 mg | ORAL_TABLET | Freq: Two times a day (BID) | ORAL | 3 refills | Status: DC

## 2023-08-30 NOTE — Patient Instructions (Addendum)
 Medication Instructions:  Your physician has recommended you make the following change in your medication:   Stop Metoprolol  Keep a 2 week BP log and return in MyChart or mail  Start Carvedilol 6.25 mg twice daily  Start Amlodipine 5 mg daily  *If you need a refill on your cardiac medications before your next appointment, please call your pharmacy*   Lab Work: None ordered If you have labs (blood work) drawn today and your tests are completely normal, you will receive your results only by: MyChart Message (if you have MyChart) OR A paper copy in the mail If you have any lab test that is abnormal or we need to change your treatment, we will call you to review the results.   Testing/Procedures: Your physician has requested that you have an echocardiogram. Echocardiography is a painless test that uses sound waves to create images of your heart. It provides your doctor with information about the size and shape of your heart and how well your heart's chambers and valves are working. This procedure takes approximately one hour. There are no restrictions for this procedure. Please do NOT wear cologne, perfume, aftershave, or lotions (deodorant is allowed). Please arrive 15 minutes prior to your appointment time.     Follow-Up: At [facility], you and your health needs are our priority.  As part of our continuing mission to provide you with exceptional heart care, we have created designated Provider Care Teams.  These Care Teams include your primary Cardiologist (physician) and Advanced Practice Providers (APPs -  Physician Assistants and Nurse Practitioners) who all work together to provide you with the care you need, when you need it.  We recommend signing up for the patient portal called "MyChart".  Sign up information is provided on this After Visit Summary.  MyChart is used to connect with patients for Virtual Visits (Telemedicine).  Patients are able to view lab/test results,  encounter notes, upcoming appointments, etc.  Non-urgent messages can be sent to your provider as well.   To learn more about what you can do with MyChart, go to [email or web address].    Your next appointment:   6 week(s)  The format for your next appointment:   In Person  Provider:   Huntley Dec, MD or Norman Herrlich, MD   Other Instructions Echocardiogram An echocardiogram is a test that uses sound waves (ultrasound) to produce images of the heart. Images from an echocardiogram can provide important information about: Heart size and shape. The size and thickness and movement of your heart's walls. Heart muscle function and strength. Heart valve function or if you have stenosis. Stenosis is when the heart valves are too narrow. If blood is flowing backward through the heart valves (regurgitation). A tumor or infectious growth around the heart valves. Areas of heart muscle that are not working well because of poor blood flow or injury from a heart attack. Aneurysm detection. An aneurysm is a weak or damaged part of an artery wall. The wall bulges out from the normal force of blood pumping through the body. Tell a health care provider about: Any allergies you have. All medicines you are taking, including vitamins, herbs, eye drops, creams, and over-the-counter medicines. Any blood disorders you have. Any surgeries you have had. Any medical conditions you have. Whether you are pregnant or may be pregnant. What are the risks? Generally, this is a safe test. However, problems may occur, including an allergic reaction to dye (contrast) that may be used during the test.  What happens before the test? No specific preparation is needed. You may eat and drink normally. What happens during the test? You will take off your clothes from the waist up and put on a hospital gown. Electrodes or electrocardiogram (ECG)patches may be placed on your chest. The electrodes or patches are then  connected to a device that monitors your heart rate and rhythm. You will lie down on a table for an ultrasound exam. A gel will be applied to your chest to help sound waves pass through your skin. A handheld device, called a transducer, will be pressed against your chest and moved over your heart. The transducer produces sound waves that travel to your heart and bounce back (or "echo" back) to the transducer. These sound waves will be captured in real-time and changed into images of your heart that can be viewed on a video monitor. The images will be recorded on a computer and reviewed by your health care provider. You may be asked to change positions or hold your breath for a short time. This makes it easier to get different views or better views of your heart. In some cases, you may receive contrast through an IV in one of your veins. This can improve the quality of the pictures from your heart. The procedure may vary among health care providers and hospitals.   What can I expect after the test? You may return to your normal, everyday life, including diet, activities, and medicines, unless your health care provider tells you not to do that. Follow these instructions at home: It is up to you to get the results of your test. Ask your health care provider, or the department that is doing the test, when your results will be ready. Keep all follow-up visits. This is important. Summary An echocardiogram is a test that uses sound waves (ultrasound) to produce images of the heart. Images from an echocardiogram can provide important information about the size and shape of your heart, heart muscle function, heart valve function, and other possible heart problems. You do not need to do anything to prepare before this test. You may eat and drink normally. After the echocardiogram is completed, you may return to your normal, everyday life, unless your health care provider tells you not to do that. This  information is not intended to replace advice given to you by your health care provider. Make sure you discuss any questions you have with your health care provider. Document Revised: 01/21/2020 Document Reviewed: 01/21/2020 Elsevier Patient Education  2021 Elsevier Inc.   Important Information About Sugar

## 2023-08-30 NOTE — Assessment & Plan Note (Signed)
 She had an episode of unprovoked syncope 4 months ago.  No further recurrence. At this time we will hold off on additional testing given no recurrent episodes and no clear etiology based on her description suspected. Recommended her to keep herself well-hydrated. If she does have any recurrent episodes or any presyncopal or lightheadedness episodes to notify us right away.

## 2023-08-30 NOTE — Assessment & Plan Note (Signed)
 As above well-controlled. Target below 130/80 mmHg. Continue Imdur as above 30 mg once daily. Switch from metoprolol XL to carvedilol 6.25 mg twice daily. Add amlodipine 5 mg once daily. Advised to keep blood pressure log for the next 2 weeks and give [place] a call if remains uncontrolled or her symptoms are worsening.Carrie Butler
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
## 2023-08-30 NOTE — Assessment & Plan Note (Signed)
 Chest pain as discussed below 2 complaints musculoskeletal and anginal. Regarding musculoskeletal in view of her prior injury, recommended further review with her PCP. From cardiac standpoint EKG shows no new changes. Her blood pressures are uncontrolled and we are targeting blood pressure optimization and will escalate antianginal medication as noted below with switch to carvedilol and addition of amlodipine.  If she remains symptomatic with symptoms suggestive of angina despite good blood pressure control, will consider further evaluation with invasive means with cardiac catheterization versus noninvasive means with cardiac PET/CT stress test given her coronary anatomy on prior angiograms. She is agreeable with this plan.

## 2023-08-30 NOTE — Assessment & Plan Note (Signed)
 CAD s/p inferior STEMI and PCI of mid RCA 05/2021 [initial cath and PCI 06/04/2021 with residual stenosis of 70% distal to the stent in a tortuous segment; early 06-07-2021 for refractory chest pain noted patent RCA stent with mild nonobstructive tissue prolapse in the midsegment; moderate to severe ostial LCx stenosis not amenable to PCI due to trifurcation lesion)  Her symptoms currently have 2 components 1 is musculoskeletal component. The component is of exertional chest discomfort.  This could appears to be anginal equivalent, however her blood pressures are poorly controlled and could be the underlying cause.  Aggressive control of blood pressure as reviewed below. Continue Plavix 75 mg once daily. Continue atorvastatin 80 mg once daily and Zetia 10 mg once daily.  For antianginal effect she is currently on metoprolol XL will be switching it to carvedilol 6.25 mg twice daily. In addition for blood pressure control starting amlodipine 5 mg once daily. Continue isosorbide mononitrate 30 mg once daily.  Will further titrate this up as needed.

## 2023-09-14 ENCOUNTER — Ambulatory Visit: Admitting: Cardiology

## 2023-09-15 ENCOUNTER — Ambulatory Visit

## 2023-09-15 DIAGNOSIS — I10 Essential (primary) hypertension: Secondary | ICD-10-CM | POA: Diagnosis not present

## 2023-09-15 DIAGNOSIS — R079 Chest pain, unspecified: Secondary | ICD-10-CM

## 2023-09-15 DIAGNOSIS — I25118 Atherosclerotic heart disease of native coronary artery with other forms of angina pectoris: Secondary | ICD-10-CM | POA: Diagnosis not present

## 2023-09-15 LAB — ECHOCARDIOGRAM COMPLETE
AR max vel: 1.4 cm2
AV Area VTI: 1.76 cm2
AV Area mean vel: 1.44 cm2
AV Mean grad: 3 mmHg
AV Peak grad: 6.7 mmHg
Ao pk vel: 1.3 m/s
Area-P 1/2: 3.45 cm2
MV M vel: 4.59 m/s
MV Peak grad: 84.3 mmHg
Radius: 0.5 cm
S' Lateral: 2.4 cm

## 2023-10-13 ENCOUNTER — Other Ambulatory Visit: Payer: Self-pay

## 2023-10-13 ENCOUNTER — Ambulatory Visit

## 2023-10-16 ENCOUNTER — Ambulatory Visit

## 2023-10-16 VITALS — BP 164/96 | HR 72 | Ht 58.6 in | Wt 97.6 lb

## 2023-10-16 DIAGNOSIS — R079 Chest pain, unspecified: Secondary | ICD-10-CM

## 2023-10-16 DIAGNOSIS — I25118 Atherosclerotic heart disease of native coronary artery with other forms of angina pectoris: Secondary | ICD-10-CM | POA: Diagnosis not present

## 2023-10-16 DIAGNOSIS — I10 Essential (primary) hypertension: Secondary | ICD-10-CM
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
## 2023-10-16 NOTE — Assessment & Plan Note (Signed)
 CAD s/p inferior STEMI and PCI of mid RCA in June 04 2021 with residual stenosis of 70% distal to the stent and a tortuous segment.  Abdominal cath 06/07/2021 for refractory chest pain noted patent RCA stent with mild nonobstructive stenosis of the mid segment, moderate to severe ostial LCx stenosis not amenable to PCI due to trifurcation lesion.  Still continues to have musculoskeletal complaint of chest pain since her sternal injury. Exertional chest discomfort which appears to be chronic however slightly progressive with symptoms of shortness of breath with day-to-day activities at home earlier than in the year past.  Continue with Plavix  75 mg once daily. Continue with atorvastatin  80 mg once daily and Zetia  10 mg once daily.  Antianginal effect continue with carvedilol  6.25 mg twice daily. Switch amlodipine  5 mg once daily from morning dose to evening dose to help facilitate blood pressure control early morning hypertension. Continue Imdur  30 mg once daily.  Blood pressures remain significantly elevated will titrate up the dose of amlodipine .  In the setting of slight progression of symptoms of shortness of breath with activities will further evaluate for any significant ischemia burden with treadmill EKG stress test with nuclear imaging.  Advised her to establish care with primary care provider for her noncardiac issues and management.

## 2023-10-16 NOTE — Patient Instructions (Signed)
 Medication Instructions:  Your physician recommends that you continue on your current medications as directed. Please refer to the Current Medication list given to you today.  Start taking Amlodipine  at night.  *If you need a refill on your cardiac medications before your next appointment, please call your pharmacy*   Lab Work: None ordered If you have labs (blood work) drawn today and your tests are completely normal, you will receive your results only by: MyChart Message (if you have MyChart) OR A paper copy in the mail If you have any lab test that is abnormal or we need to change your treatment, we will call you to review the results.   Testing/Procedures: You are scheduled for a Myocardial Perfusion Imaging Study.  Please arrive 15 minutes prior to your appointment time for registration and insurance purposes.  The test will take approximately 3 to 4 hours to complete; you may bring reading material.  If someone comes with you to your appointment, they will need to remain in the main lobby due to limited space in the testing area.   How to prepare for your Myocardial Perfusion Test: Do not eat or drink 3 hours prior to your test, except you may have water. Do not consume products containing caffeine (regular or decaffeinated) 12 hours prior to your test. (ex: coffee, chocolate, sodas, tea). Do bring a list of your current medications with you.  If not listed below, you may take your medications as normal. Do not take carvedilol  (Coreg ) for 24 hours prior to the test.  Bring the medication to your appointment as you may be required to take it once the test is complete. Do wear comfortable clothes (no dresses or overalls) and walking shoes, tennis shoes preferred (No heels or open toe shoes are allowed). Do NOT wear cologne, perfume, aftershave, or lotions (deodorant is allowed). If these instructions are not followed, your test will have to be rescheduled.  If you cannot keep your  appointment, please provide 24 hours notification to the Nuclear Lab, to avoid a possible $50 charge to your account.  Follow-Up: At [facility], you and your health needs are our priority.  As part of our continuing mission to provide you with exceptional heart care, we have created designated Provider Care Teams.  These Care Teams include your primary Cardiologist (physician) and Advanced Practice Providers (APPs -  Physician Assistants and Nurse Practitioners) who all work together to provide you with the care you need, when you need it.  We recommend signing up for the patient portal called "MyChart".  Sign up information is provided on this After Visit Summary.  MyChart is used to connect with patients for Virtual Visits (Telemedicine).  Patients are able to view lab/test results, encounter notes, upcoming appointments, etc.  Non-urgent messages can be sent to your provider as well.   To learn more about what you can do with MyChart, go to [email or web address].    Your next appointment:   3 month(s)  Provider:   Bertha Broad, MD   Other Instructions  Cardiac Nuclear Scan A cardiac nuclear scan is a test that is done to check the flow of blood to your heart. It is done when you are resting and when you are exercising. The test looks for problems such as: Not enough blood reaching a portion of the heart. The heart muscle not working as it should. You may need this test if you have: Heart disease. Lab results that are not normal. Had heart surgery  or a balloon procedure to open up blocked arteries (angioplasty) or a small mesh tube (stent). Chest pain. Shortness of breath. Had a heart attack. In this test, a special dye (tracer) is put into your bloodstream. The tracer will travel to your heart. A camera will then take pictures of your heart to see how the tracer moves through your heart. This test is usually done at a hospital and takes 2-4 hours. Tell a doctor  about: Any allergies you have. All medicines you are taking, including vitamins, herbs, eye drops, creams, and over-the-counter medicines. Any bleeding problems you have. Any surgeries you have had. Any medical conditions you have. Whether you are pregnant or may be pregnant. Any history of asthma or long-term (chronic) lung disease. Any history of heart rhythm disorders or heart valve conditions. What are the risks? Your doctor will talk with you about risks. These may include: Serious chest pain and heart attack. This is only a risk if the stress portion of the test is done. Fast or uneven heartbeats (palpitations). A feeling of warmth in your chest. This feeling usually does not last long. Allergic reaction to the tracer. Shortness of breath or trouble breathing. What happens before the test? Ask your doctor about changing or stopping your normal medicines. Follow instructions from your doctor about what you cannot eat or drink. Remove your jewelry on the day of the test. Ask your doctor if you need to avoid nicotine or caffeine. What happens during the test? An IV tube will be inserted into one of your veins. Your doctor will give you a small amount of tracer through the IV tube. You will wait for 20-40 minutes while the tracer moves through your bloodstream. Your heart will be monitored with an electrocardiogram (ECG). You will lie down on an exam table. Pictures of your heart will be taken for about 15-20 minutes. You may also have a stress test. For this test, one of these things may be done: You will be asked to exercise on a treadmill or a stationary bike. You will be given medicines that will make your heart work harder. This is done if you are unable to exercise. When blood flow to your heart has peaked, a tracer will again be given through the IV tube. After 20-40 minutes, you will get back on the exam table. More pictures will be taken of your heart. Depending on the  tracer that is used, more pictures may need to be taken 3-4 hours later. Your IV tube will be removed when the test is over. The test may vary among doctors and hospitals. What happens after the test? Ask your doctor: Whether you can return to your normal schedule, including diet, activities, travel, and medicines. Whether you should drink more fluids. This will help to remove the tracer from your body. Ask your doctor, or the department that is doing the test: When will my results be ready? How will I get my results? What are my treatment options? What other tests do I need? What are my next steps? This information is not intended to replace advice given to you by your health care provider. Make sure you discuss any questions you have with your health care provider. Document Revised: 10/26/2021 Document Reviewed: 10/26/2021 Elsevier Patient Education  2023 [employer].

## 2023-10-16 NOTE — Assessment & Plan Note (Addendum)
 Blood pressure log from home reviewed today.  Typically uncontrolled blood pressure systolic 140s and diastolic up to 100 in the mornings.  Later in the day improved. Early morning elevated blood pressures. Will switch amlodipine  dose to nighttime dosing of 5 mg once daily. Continue carvedilol  6.25 twice daily and Imdur  30 mg once daily. Target blood pressure below 130/80 mmHg. Appears to have a component of anxiety as blood pressures are elevated.  When she comes in for office visits and does have low blood pressures after she uses marijuana at home.

## 2023-11-01 ENCOUNTER — Ambulatory Visit

## 2023-11-01 VITALS — Ht 59.0 in | Wt 97.0 lb

## 2023-11-01 DIAGNOSIS — I25118 Atherosclerotic heart disease of native coronary artery with other forms of angina pectoris: Secondary | ICD-10-CM | POA: Diagnosis present

## 2023-11-01 DIAGNOSIS — R110 Nausea: Secondary | ICD-10-CM | POA: Insufficient documentation

## 2023-11-01 MED ORDER — TECHNETIUM TC 99M TETROFOSMIN IV KIT
10.4000 | PACK | Freq: Once | INTRAVENOUS | Status: AC | PRN
  Administered 2023-11-01: 10.4 via INTRAVENOUS

## 2023-11-01 MED ORDER — TECHNETIUM TC 99M TETROFOSMIN IV KIT
31.5000 | PACK | Freq: Once | INTRAVENOUS | Status: AC | PRN
  Administered 2023-11-01: 31.5 via INTRAVENOUS

## 2023-11-01 MED ORDER — AMINOPHYLLINE 25 MG/ML IV SOLN
75.0000 mg | Freq: Once | INTRAVENOUS | Status: AC
  Administered 2023-11-01: 75 mg via INTRAVENOUS

## 2023-11-01 MED ORDER — REGADENOSON 0.4 MG/5ML IV SOLN
0.4000 mg | Freq: Once | INTRAVENOUS | Status: AC
  Administered 2023-11-01: 0.4 mg via INTRAVENOUS

## 2023-11-02 LAB — MYOCARDIAL PERFUSION IMAGING
LV dias vol: 51 mL (ref 46–106)
LV sys vol: 15 mL
Nuc Stress EF: 70 %
Peak HR: 122 {beats}/min
Rest HR: 78 {beats}/min
Rest Nuclear Isotope Dose: 10.4 mCi
SDS: 4
SRS: 3
SSS: 7
ST Depression (mm): 0 mm
Stress Nuclear Isotope Dose: 31.5 mCi
TID: 0.95

## 2023-11-08 ENCOUNTER — Ambulatory Visit: Payer: Self-pay

## 2023-11-08 DIAGNOSIS — I10 Essential (primary) hypertension: Secondary | ICD-10-CM

## 2023-11-09 MED ORDER — CARVEDILOL 12.5 MG PO TABS: 12.5000 mg | ORAL_TABLET | Freq: Two times a day (BID) | ORAL | 3 refills | Status: DC

## 2023-11-09 NOTE — Telephone Encounter (Signed)
-----   Message from [place] R Madireddy sent at 11/08/2023  5:55 PM EDT ----- Mildly abnormal stress test results. Please schedule her for follow-up visit to further discuss this and elevated blood pressures at the stress test. Blood pressures were significantly elevated at stress test, recommend titrating up carvedilol  to 12.5 mg twice daily.  Advise her to continue logging her blood pressures at home closely  Will further review at follow-up visit.  Thank you

## 2023-11-09 NOTE — Telephone Encounter (Signed)
 MyChart message

## 2023-11-15 ENCOUNTER — Ambulatory Visit

## 2023-11-15 VITALS — BP 136/88 | HR 58 | Ht 58.6 in | Wt 97.4 lb

## 2023-11-15 DIAGNOSIS — I70213 Atherosclerosis of native arteries of extremities with intermittent claudication, bilateral legs: Secondary | ICD-10-CM | POA: Insufficient documentation

## 2023-11-15 DIAGNOSIS — E782 Mixed hyperlipidemia: Secondary | ICD-10-CM | POA: Diagnosis not present

## 2023-11-15 DIAGNOSIS — I739 Peripheral vascular disease, unspecified: Secondary | ICD-10-CM | POA: Diagnosis present

## 2023-11-15 DIAGNOSIS — I25118 Atherosclerotic heart disease of native coronary artery with other forms of angina pectoris: Secondary | ICD-10-CM | POA: Insufficient documentation

## 2023-11-15 DIAGNOSIS — I10 Essential (primary) hypertension: Secondary | ICD-10-CM | POA: Diagnosis present

## 2023-11-15 MED ORDER — AMLODIPINE BESYLATE 10 MG PO TABS: 10.0000 mg | ORAL_TABLET | Freq: Every day | ORAL | 3 refills | Status: AC

## 2023-11-15 NOTE — Patient Instructions (Signed)
 Medication Instructions:  Your physician has recommended you make the following change in your medication:   Increase your Amlodipine  to 10 mg daily.  *If you need a refill on your cardiac medications before your next appointment, please call your pharmacy*   Lab Work: None ordered If you have labs (blood work) drawn today and your tests are completely normal, you will receive your results only by: MyChart Message (if you have MyChart) OR A paper copy in the mail If you have any lab test that is abnormal or we need to change your treatment, we will call you to review the results.   Testing/Procedures: None ordered   Follow-Up: At [facility], you and your health needs are our priority.  As part of our continuing mission to provide you with exceptional heart care, we have created designated Provider Care Teams.  These Care Teams include your primary Cardiologist (physician) and Advanced Practice Providers (APPs -  Physician Assistants and Nurse Practitioners) who all work together to provide you with the care you need, when you need it.  We recommend signing up for the patient portal called "MyChart".  Sign up information is provided on this After Visit Summary.  MyChart is used to connect with patients for Virtual Visits (Telemedicine).  Patients are able to view lab/test results, encounter notes, upcoming appointments, etc.  Non-urgent messages can be sent to your provider as well.   To learn more about what you can do with MyChart, go to [email or web address].    Your next appointment:   3 month(s)  The format for your next appointment:   In Person  Provider:   Bertha Broad, MD    Other Instructions none  Important Information About Sugar

## 2023-11-15 NOTE — Assessment & Plan Note (Signed)
 She previously had evaluation with vascular surgeon and has not had follow-up in some time. She reports bilateral lower extremity claudication-like symptoms after walking a certain distance up an incline.  Relieved with rest. Physically I do not see any lower extremity ulcers.  Have good dorsalis pedis pulses bilaterally.  Recommended following up with vascular team, will place a referral if this needs to be done for her appointment to be scheduled.  Will also need evaluation and follow-up with regards to her history of vertebral artery disease.

## 2023-11-15 NOTE — Assessment & Plan Note (Signed)
 CAD s/p inferior STEMI and PCI of mid RCA in June 04 2021 with residual stenosis of 70% distal to the stent and a tortuous segment.  Abdominal cath 06/07/2021 for refractory chest pain noted patent RCA stent with mild nonobstructive stenosis of the mid segment, moderate to severe ostial LCx stenosis not amenable to PCI due to trifurcation lesion.  Stress nuclear imaging 11/01/2023 abnormal with basal to mid inferior segment reversible defect suggestive of ischemia.  She however at this time prefers to hold off on any intervention and invasive workup.  She feels her symptoms are not significant enough and she would like to try medical therapy optimization at first.  Continue with clopidogrel  75 mg once daily Continue with Imdur  30 mg once daily Continue amlodipine  titrate up the dose to 10 mg once daily at bedtime. Continue with carvedilol  6.25 mg twice daily.  If symptoms are progressive advised her to notify us  right away and would proceed with cardiac catheterization.  She is agreeable with this plan. She is also aware to call 911 or head to the ER if symptoms are acutely worsening or having sustained episodes.

## 2023-11-15 NOTE — Assessment & Plan Note (Signed)
 Last lipid panel available to review is from 07/25/2022 with LDL 67, HDL 75, total cholesterol 161, triglycerides 113. Continue with the atorvastatin  80 mg once daily and Zetia  10 mg once daily.
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
## 2023-11-15 NOTE — Assessment & Plan Note (Signed)
 Blood pressures much improved today. Home log of blood pressure readings show elevated readings in the morning and later in the day and much improved.  She did not titrate up carvedilol  as recommended after the stress test. Continue with carvedilol  6.25 mg twice daily. Continue Imdur  30 mg once daily. Titrate up dose of amlodipine  to 10 mg once daily which should help both with blood pressure control and anginal symptoms.  Target blood pressure below 130/80 mmHg.

## 2023-12-11 ENCOUNTER — Other Ambulatory Visit: Payer: Self-pay | Admitting: Cardiology

## 2023-12-11 NOTE — Telephone Encounter (Signed)
 Rx refill sent to pharmacy.

## 2023-12-13 ENCOUNTER — Other Ambulatory Visit: Payer: Self-pay | Admitting: *Deleted

## 2023-12-13 DIAGNOSIS — I70213 Atherosclerosis of native arteries of extremities with intermittent claudication, bilateral legs: Secondary | ICD-10-CM

## 2023-12-25 ENCOUNTER — Other Ambulatory Visit: Payer: Self-pay | Admitting: Cardiology

## 2023-12-27 ENCOUNTER — Ambulatory Visit (INDEPENDENT_AMBULATORY_CARE_PROVIDER_SITE_OTHER): Admitting: Vascular Surgery

## 2023-12-27 ENCOUNTER — Other Ambulatory Visit: Payer: Self-pay

## 2023-12-27 ENCOUNTER — Ambulatory Visit (HOSPITAL_COMMUNITY)
Admission: RE | Admit: 2023-12-27 | Discharge: 2023-12-27 | Disposition: A | Discharge status: Home / Self Care | Source: Ambulatory Visit | Attending: Vascular Surgery | Admitting: Vascular Surgery

## 2023-12-27 ENCOUNTER — Encounter: Payer: Self-pay | Admitting: Vascular Surgery

## 2023-12-27 VITALS — BP 143/96 | HR 71 | Temp 98.0°F | Ht <= 58 in | Wt 94.0 lb

## 2023-12-27 DIAGNOSIS — I70213 Atherosclerosis of native arteries of extremities with intermittent claudication, bilateral legs: Secondary | ICD-10-CM | POA: Diagnosis not present

## 2023-12-27 LAB — VAS US ABI WITH/WO TBI
Left ABI: 0.92
Right ABI: 1.01
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
## 2024-01-01 ENCOUNTER — Other Ambulatory Visit: Payer: Self-pay

## 2024-01-01 DIAGNOSIS — I70213 Atherosclerosis of native arteries of extremities with intermittent claudication, bilateral legs: Secondary | ICD-10-CM

## 2024-01-12 ENCOUNTER — Other Ambulatory Visit: Payer: Self-pay | Admitting: Cardiology

## 2024-01-16 ENCOUNTER — Encounter (HOSPITAL_COMMUNITY)

## 2024-01-29 ENCOUNTER — Ambulatory Visit

## 2024-02-29 ENCOUNTER — Ambulatory Visit

## 2024-02-29 VITALS — BP 102/58 | HR 70 | Ht 63.0 in | Wt 96.8 lb

## 2024-02-29 DIAGNOSIS — I25118 Atherosclerotic heart disease of native coronary artery with other forms of angina pectoris: Secondary | ICD-10-CM | POA: Diagnosis not present

## 2024-02-29 DIAGNOSIS — I10 Essential (primary) hypertension: Secondary | ICD-10-CM | POA: Insufficient documentation

## 2024-02-29 DIAGNOSIS — E782 Mixed hyperlipidemia: Secondary | ICD-10-CM | POA: Diagnosis present

## 2024-02-29 NOTE — Assessment & Plan Note (Signed)
 Well-controlled on last lipid panel February 2024. Continue atorvastatin  80 mg once daily and Zetia  10 mg once daily.

## 2024-02-29 NOTE — Assessment & Plan Note (Signed)
 Improved blood pressures on current regimen with carvedilol , Imdur  and amlodipine  as reviewed with doses above.  Continue same regimen. Blood pressure below 130/80 mmHg.
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
## 2024-02-29 NOTE — Assessment & Plan Note (Signed)
 CAD s/p inferior STEMI and PCI of mid RCA in June 04 2021 with residual stenosis of 70% distal to the stent and a tortuous segment.  Abdominal cath 06/07/2021 for refractory chest pain noted patent RCA stent with mild nonobstructive stenosis of the mid segment, moderate to severe ostial LCx stenosis not amenable to PCI due to trifurcation lesion.   Stress nuclear imaging 11/01/2023 abnormal with basal to mid inferior segment reversible defect suggestive of ischemia.  Symptoms improved with optimizing medical therapy. Continue Plavix  75 mg once daily. Continue Coreg  6.25 twice daily Imdur  30 mg once daily Amlodipine  10 mg once daily. Continue atorvastatin  80 mg once daily and Zetia  10 mg once daily.  If she were to have any further worsening of her anginal symptoms or effort tolerance we will proceed with invasive assessment with cardiac cath.

## 2024-02-29 NOTE — Patient Instructions (Signed)

## 2024-03-01 ENCOUNTER — Other Ambulatory Visit: Payer: Self-pay

## 2024-03-01 DIAGNOSIS — I70213 Atherosclerosis of native arteries of extremities with intermittent claudication, bilateral legs: Secondary | ICD-10-CM

## 2024-04-08 ENCOUNTER — Other Ambulatory Visit: Payer: Self-pay | Admitting: Cardiology

## 2024-04-10 ENCOUNTER — Ambulatory Visit (HOSPITAL_COMMUNITY)
Admission: RE | Admit: 2024-04-10 | Discharge: 2024-04-10 | Disposition: A | Source: Ambulatory Visit | Attending: Vascular Surgery

## 2024-04-10 ENCOUNTER — Ambulatory Visit (HOSPITAL_COMMUNITY)
Admission: RE | Admit: 2024-04-10 | Discharge: 2024-04-10 | Disposition: A | Discharge status: Home / Self Care | Source: Ambulatory Visit | Attending: Vascular Surgery | Admitting: Vascular Surgery

## 2024-04-10 ENCOUNTER — Ambulatory Visit (INDEPENDENT_AMBULATORY_CARE_PROVIDER_SITE_OTHER): Admitting: Vascular Surgery

## 2024-04-10 ENCOUNTER — Encounter: Payer: Self-pay | Admitting: Vascular Surgery

## 2024-04-10 VITALS — BP 141/89 | HR 85 | Temp 98.0°F | Ht 63.0 in | Wt 94.0 lb

## 2024-04-10 DIAGNOSIS — I70213 Atherosclerosis of native arteries of extremities with intermittent claudication, bilateral legs: Secondary | ICD-10-CM | POA: Diagnosis present

## 2024-04-10 LAB — VAS US LOWER EXTREMITY EXERCISE
Left ABI: 1.05
Right ABI: 1.12

## 2024-04-10 NOTE — Progress Notes (Signed)
 Patient ID: Carrie Butler, female   DOB: 12-15-1960, 63 y.o.   MRN: [id number]  Reason for Consult: Follow-up   Referred by Sheree Penne Palma*  Subjective:     HPI:  Carrie Butler is a 63 y.o. female with history of coronary artery disease with myocardial infarction requiring drug-eluting stenting 3 years ago.  She also has history of iliac stenting 1 year after her coronary artery stents.  She has had some pain in her bilateral thighs which has been present for a couple years.  She walks without claudication in her calfs but does have heaviness in her legs particularly walking inclines.  She does not have tissue loss or ulceration.  She also has shortness of breath with ambulation.  She continues statin and Plavix .  She is a former smoker quit after coronary event.  Past Medical History:  Diagnosis Date   Atherosclerosis of native arteries of extremity with intermittent claudication 05/08/2022   Chest pain of uncertain etiology 08/30/2023   Coronary artery disease    Coronary artery disease involving native coronary artery of native heart with unstable angina pectoris (HCC) 06/09/2021   Cardiac Cath-PCI 06/03/2021: Mid RCA 100% occlusion - ostial to distal mid RCA PCI with 3 overlapping DES stents (distal 2.5 x 30, mid 3.0 x 18, ostial-proximal 3.5 x 22 Onyx Frontier DES) but unable to pass stent to the distal 70% stenosis (very angulated).  Also noted ostial LCx 70 to 80% and OM1 mid 70% (recommended medical therapy).. Cardiac Cath 06/07/2021: mid-distal LM 30%-< Ost LCx ~80% (s   Dysphagia 06/10/2021   Hyperlipidemia    Hyperlipidemia LDL goal <70 06/03/2021   Hypertension    Hypokalemia 06/10/2021   Left carotid artery stenosis 06/05/2021   Leukocytosis 06/09/2021   Non-STEMI (non-ST elevated myocardial infarction) (HCC) 06/08/2021   NSTEMI (non-ST elevated myocardial infarction) (HCC) 06/07/2021   Presence of drug coated stent in right coronary artery 06/09/2021    Cardiac Cath-PCI 06/03/2021: (Inferior STEMI) mid RCA 100% occlusion - ostial to distal mid RCA PCI with 3 overlapping DES stents (distal 2.5 x 30, mid 3.0 x 18, ostial-proximal 3.5 x 22 Onyx Frontier DES) but unable to pass stent to the distal 70% stenosis (very angulated).   Primary hypertension 06/03/2021   ST elevation myocardial infarction (STEMI) of inferior wall, subsequent episode of care (HCC) 06/03/2021   Syncope and collapse 08/30/2023   Tobacco use 06/03/2021   Family History  Problem Relation Age of Onset   Heart disease Mother    Past Surgical History:  Procedure Laterality Date   CARDIAC CATHETERIZATION     CORONARY/GRAFT ACUTE MI REVASCULARIZATION N/A 06/03/2021   Procedure: Coronary/Graft Acute MI Revascularization;  Surgeon: Claudene Victory LELON, MD;  Location: [facility] INVASIVE CV LAB;  Service: Cardiovascular;  Laterality: N/A;   CORONARY/GRAFT ACUTE MI REVASCULARIZATION N/A 06/07/2021   Procedure: Coronary/Graft Acute MI Revascularization;  Surgeon: Jordan, Peter M, MD;  Location: [facility] INVASIVE CV LAB;  Service: Cardiovascular;  Laterality: N/A;   LEFT HEART CATH AND CORONARY ANGIOGRAPHY N/A 06/03/2021   Procedure: LEFT HEART CATH AND CORONARY ANGIOGRAPHY;  Surgeon: Claudene Victory LELON, MD;  Location: [facility] INVASIVE CV LAB;  Service: Cardiovascular;  Laterality: N/A;   LEFT HEART CATH AND CORONARY ANGIOGRAPHY N/A 06/07/2021   Procedure: LEFT HEART CATH AND CORONARY ANGIOGRAPHY;  Surgeon: Jordan, Peter M, MD;  Location: [facility] INVASIVE CV LAB;  Service: Cardiovascular;  Laterality: N/A;   LOWER EXTREMITY ANGIOGRAPHY Left 12/21/2021   Procedure: Lower Extremity Angiography;  Surgeon: Jama Cordella MATSU, MD;  Location: [facility] INVASIVE CV LAB;  Service: Cardiovascular;  Laterality: Left;    Short Social History:  Social History   Tobacco Use   Smoking status: Former    Current packs/day: 0.00    Average packs/day: 1 pack/day for 15.0 years (15.0 ttl pk-yrs)    Types: Cigarettes    Start date:  06/03/2006    Quit date: 06/03/2021    Years since quitting: 2.8    Passive exposure: Past   Smokeless tobacco: Never  Substance Use Topics   Alcohol use: Never    Allergies  Allergen Reactions   Ranexa  [Ranolazine ] Shortness Of Breath and Nausea And Vomiting   Norco [Hydrocodone -Acetaminophen ] Nausea And Vomiting   Latex Rash    Very mild rash    Current Outpatient Medications  Medication Sig Dispense Refill   amLODipine  (NORVASC ) 10 MG tablet Take 1 tablet (10 mg total) by mouth daily. 90 tablet 3   atorvastatin  (LIPITOR ) 80 MG tablet Take 1 tablet (80 mg total) by mouth daily. 90 tablet 2   carvedilol  (COREG ) 6.25 MG tablet Take 6.25 mg by mouth 2 (two) times daily with a meal.     clopidogrel  (PLAVIX ) 75 MG tablet TAKE ONE TABLET BY MOUTH ONCE DAILY 90 tablet 3   ezetimibe  (ZETIA ) 10 MG tablet TAKE ONE TABLET BY MOUTH ONCE DAILY 90 tablet 3   isosorbide  mononitrate (IMDUR ) 30 MG 24 hr tablet Take 1 tablet (30 mg total) by mouth daily. 90 tablet 3   nitroGLYCERIN  (NITROSTAT ) 0.4 MG SL tablet Place 1 tablet (0.4 mg total) under the tongue every 5 (five) minutes as needed for chest pain. 25 tablet 3   pantoprazole  (PROTONIX ) 40 MG tablet Take 40 mg by mouth 2 (two) times daily.     No current facility-administered medications for this visit.    Review of Systems  Constitutional:  Constitutional negative. HENT: HENT negative.  Eyes: Eyes negative.  Respiratory: Positive for shortness of breath.  GI: Gastrointestinal negative.  Musculoskeletal: Positive for leg pain.  Skin: Skin negative.  Neurological: Neurological negative. Hematologic: Hematologic/lymphatic negative.  Psychiatric: Psychiatric negative.        Objective:  Objective  Vitals:   04/10/24 1130  BP: (!) 141/89  Pulse: 85  Temp: 98 F (36.7 C)  SpO2: 96%    Physical Exam HENT:     Head: Normocephalic.     Nose: Nose normal.     Mouth/Throat:     Mouth: Mucous membranes are moist.   Cardiovascular:     Rate and Rhythm: Normal rate.     Pulses: Normal pulses.  Pulmonary:     Effort: Pulmonary effort is normal.  Musculoskeletal:        General: Normal range of motion.     Cervical back: Normal range of motion.     Right lower leg: No edema.     Left lower leg: No edema.  Skin:    General: Skin is warm.     Capillary Refill: Capillary refill takes less than 2 seconds.  Neurological:     Mental Status: She is alert.     Data: Abdominal Aorta Findings:  +-------------+-------+----------+----------+---------+--------+--------+  Location    AP (cm)Trans (cm)PSV (cm/s)Waveform ThrombusComments  +-------------+-------+----------+----------+---------+--------+--------+  Proximal    2.15   2.07      46        biphasic                   +-------------+-------+----------+----------+---------+--------+--------+  Mid         1.95   1.96      56        biphasic                   +-------------+-------+----------+----------+---------+--------+--------+  Distal      1.44   1.40      65        triphasic                  +-------------+-------+----------+----------+---------+--------+--------+  RT EIA Distal                 167                                  +-------------+-------+----------+----------+---------+--------+--------+  LT EIA Distal                 149                                  +-------------+-------+----------+----------+---------+--------+--------+     IVC/Iliac Findings:  +-------+------+--------+--------+   IVC  PatentThrombusComments  +-------+------+--------+--------+  IVC Midpatent                  +-------+------+--------+--------+         Right Stent(s):  +---------------+--------+--------+---------+--------+  RT CIA         PSV cm/sStenosisWaveform Comments  +---------------+--------+--------+---------+--------+  Prox to Stent  76              triphasic           +---------------+--------+--------+---------+--------+  Proximal Stent 138             biphasic           +---------------+--------+--------+---------+--------+  Mid Stent      148             biphasic           +---------------+--------+--------+---------+--------+  Distal Stent   149             biphasic           +---------------+--------+--------+---------+--------+  Distal to Stent170             biphasic           +---------------+--------+--------+---------+--------+           Left Stent(s):  +---------------+--------+--------------+--------+--------+  LT CIA         PSV cm/sStenosis      WaveformComments  +---------------+--------+--------------+--------+--------+  Prox to Stent  49                    biphasic          +---------------+--------+--------------+--------+--------+  Proximal Stent 135     1-49% stenosisbiphasic          +---------------+--------+--------------+--------+--------+  Mid Stent      119                   biphasic          +---------------+--------+--------------+--------+--------+  Distal Stent   146                   biphasic          +---------------+--------+--------------+--------+--------+  Distal to [id number]                   biphasic          +---------------+--------+--------------+--------+--------+  Summary:  Stenosis: +--------------------+--------------+  Location            Stent           +--------------------+--------------+  Right Common Iliac  no stenosis     +--------------------+--------------+  Left Common Iliac   1-49% stenosis  +--------------------+--------------+  Right External Iliacno stenosis     +--------------------+--------------+    ABI Findings:  +---------+------------------+-----+--------+--------+  Right   Rt Pressure (mmHg)IndexWaveformComment   +---------+------------------+-----+--------+--------+  Brachial 145                                       +---------+------------------+-----+--------+--------+  PTA     167               1.12                   +---------+------------------+-----+--------+--------+  DP      153               1.03                   +---------+------------------+-----+--------+--------+  Great Toe152               1.02                   +---------+------------------+-----+--------+--------+   +---------+------------------+-----+--------+-------+  Left    Lt Pressure (mmHg)IndexWaveformComment  +---------+------------------+-----+--------+-------+  Brachial 149                                     +---------+------------------+-----+--------+-------+  PTA     157               1.05                  +---------+------------------+-----+--------+-------+  DP      145               0.97                  +---------+------------------+-----+--------+-------+  Great Toe159               1.07                  +---------+------------------+-----+--------+-------+    Exercise:   The patient walked on the treadmill for 5 minutes, at 2.5 mph and a 12  incline.   +-------+--------+---------+--------------------------------+  Time  LocationSymptom  Comments                          +-------+--------+---------+--------------------------------+  45 sec Thigh   HeavinessSOB, heaviness in lateral thighs  +-------+--------+---------+--------------------------------+  90 sec Thigh   HeavinessHard to pick up feet              +-------+--------+---------+--------------------------------+  150 secThigh   HeavinessSame as before                    +-------+--------+---------+--------------------------------+  180 secThigh   HeavinessR > L                             +-------+--------+---------+--------------------------------+  300 sec                 STOP                               +-------+--------+---------+--------------------------------+  After exercise the right ABI decreased, which is an abnormal exercise  response. After exercise the left ABI decreased, which is an abnormal  exercise response.           Assessment/Plan:    63yo history of bilateral common iliac artery stents there appears to be some stenosis in the left.  She underwent walking test today which was abnormal however she was able to make it through with the shortness of breath and heaviness of her legs.  She does have readily palpable pulses at baseline.  Her symptoms appear multifactorial.  She will continue Plavix  and statin and can follow-up with me in 1 year with repeat aortoiliac duplex and ABIs.     Penne Lonni Colorado MD [facility]

## 2024-04-26 ENCOUNTER — Telehealth: Payer: Self-pay | Admitting: Cardiology

## 2024-04-26 NOTE — Telephone Encounter (Signed)
 Error

## 2024-05-01 ENCOUNTER — Encounter (HOSPITAL_COMMUNITY)

## 2024-05-01 ENCOUNTER — Ambulatory Visit: Admitting: Vascular Surgery

## 2024-05-03 ENCOUNTER — Telehealth: Payer: Self-pay | Admitting: Cardiology

## 2024-05-03 NOTE — Telephone Encounter (Signed)
 Patient stated she had a conversation regarding the type of home air purifier Dr. Monetta uses.

## 2024-05-03 NOTE — Telephone Encounter (Signed)
 Spoke with pt and advised that Dr. Monetta is out of the office today and will return Monday.

## 2024-05-08 ENCOUNTER — Encounter (HOSPITAL_COMMUNITY)

## 2024-05-08 ENCOUNTER — Ambulatory Visit: Admitting: Vascular Surgery

## 2024-05-14 NOTE — Telephone Encounter (Signed)
 Pt called in for an update on this message.

## 2024-07-01 ENCOUNTER — Other Ambulatory Visit: Payer: Self-pay
